# Patient Record
Sex: Female | Born: 1960 | Race: White | Hispanic: No | Smoking: Never smoker
Health system: Southern US, Community
[De-identification: ages and names within clinical notes are randomized; demographics above are authoritative.]

## PROBLEM LIST (undated history)

## (undated) DIAGNOSIS — G459 Transient cerebral ischemic attack, unspecified: Secondary | ICD-10-CM

## (undated) DIAGNOSIS — C801 Malignant (primary) neoplasm, unspecified: Secondary | ICD-10-CM

## (undated) DIAGNOSIS — I639 Cerebral infarction, unspecified: Secondary | ICD-10-CM

## (undated) DIAGNOSIS — E871 Hypo-osmolality and hyponatremia: Secondary | ICD-10-CM

## (undated) DIAGNOSIS — E119 Type 2 diabetes mellitus without complications: Secondary | ICD-10-CM

## (undated) HISTORY — PX: ABDOMINAL HYSTERECTOMY: SHX81

## (undated) HISTORY — PX: CORONARY ARTERY BYPASS GRAFT: SHX141

---

## 2003-06-21 ENCOUNTER — Ambulatory Visit (HOSPITAL_COMMUNITY): Admission: RE | Admit: 2003-06-21 | Discharge: 2003-06-21 | Payer: Self-pay | Admitting: Orthopedic Surgery

## 2003-06-21 ENCOUNTER — Ambulatory Visit (HOSPITAL_BASED_OUTPATIENT_CLINIC_OR_DEPARTMENT_OTHER): Admission: RE | Admit: 2003-06-21 | Discharge: 2003-06-21 | Payer: Self-pay | Admitting: Orthopedic Surgery

## 2005-02-23 ENCOUNTER — Encounter: Admission: RE | Admit: 2005-02-23 | Discharge: 2005-02-23 | Payer: Self-pay | Admitting: Orthopedic Surgery

## 2005-03-14 ENCOUNTER — Encounter: Admission: RE | Admit: 2005-03-14 | Discharge: 2005-03-14 | Payer: Self-pay | Admitting: Orthopedic Surgery

## 2014-12-24 ENCOUNTER — Inpatient Hospital Stay (HOSPITAL_COMMUNITY)
Admission: EM | Admit: 2014-12-24 | Discharge: 2014-12-27 | DRG: 643 | Disposition: A | Discharge status: Other Acute Inpatient Hospital | Payer: Medicare Other | Attending: Internal Medicine | Admitting: Internal Medicine

## 2014-12-24 ENCOUNTER — Emergency Department (HOSPITAL_COMMUNITY): Payer: Medicare Other

## 2014-12-24 ENCOUNTER — Encounter (HOSPITAL_COMMUNITY): Payer: Self-pay | Admitting: *Deleted

## 2014-12-24 DIAGNOSIS — E871 Hypo-osmolality and hyponatremia: Secondary | ICD-10-CM | POA: Diagnosis present

## 2014-12-24 DIAGNOSIS — C7951 Secondary malignant neoplasm of bone: Secondary | ICD-10-CM | POA: Diagnosis present

## 2014-12-24 DIAGNOSIS — E119 Type 2 diabetes mellitus without complications: Secondary | ICD-10-CM | POA: Diagnosis present

## 2014-12-24 DIAGNOSIS — C787 Secondary malignant neoplasm of liver and intrahepatic bile duct: Secondary | ICD-10-CM | POA: Diagnosis present

## 2014-12-24 DIAGNOSIS — R410 Disorientation, unspecified: Secondary | ICD-10-CM | POA: Diagnosis present

## 2014-12-24 DIAGNOSIS — E222 Syndrome of inappropriate secretion of antidiuretic hormone: Secondary | ICD-10-CM | POA: Diagnosis not present

## 2014-12-24 DIAGNOSIS — I10 Essential (primary) hypertension: Secondary | ICD-10-CM | POA: Diagnosis present

## 2014-12-24 DIAGNOSIS — Z90710 Acquired absence of both cervix and uterus: Secondary | ICD-10-CM

## 2014-12-24 DIAGNOSIS — N390 Urinary tract infection, site not specified: Secondary | ICD-10-CM | POA: Diagnosis present

## 2014-12-24 DIAGNOSIS — Z8673 Personal history of transient ischemic attack (TIA), and cerebral infarction without residual deficits: Secondary | ICD-10-CM

## 2014-12-24 DIAGNOSIS — Z951 Presence of aortocoronary bypass graft: Secondary | ICD-10-CM

## 2014-12-24 DIAGNOSIS — G9341 Metabolic encephalopathy: Secondary | ICD-10-CM | POA: Diagnosis present

## 2014-12-24 DIAGNOSIS — Z79899 Other long term (current) drug therapy: Secondary | ICD-10-CM

## 2014-12-24 DIAGNOSIS — Z91040 Latex allergy status: Secondary | ICD-10-CM

## 2014-12-24 DIAGNOSIS — G9340 Encephalopathy, unspecified: Secondary | ICD-10-CM | POA: Diagnosis present

## 2014-12-24 DIAGNOSIS — Z888 Allergy status to other drugs, medicaments and biological substances status: Secondary | ICD-10-CM

## 2014-12-24 DIAGNOSIS — C3490 Malignant neoplasm of unspecified part of unspecified bronchus or lung: Secondary | ICD-10-CM | POA: Diagnosis present

## 2014-12-24 DIAGNOSIS — Y92481 Parking lot as the place of occurrence of the external cause: Secondary | ICD-10-CM

## 2014-12-24 DIAGNOSIS — W19XXXA Unspecified fall, initial encounter: Secondary | ICD-10-CM | POA: Diagnosis present

## 2014-12-24 HISTORY — DX: Transient cerebral ischemic attack, unspecified: G45.9

## 2014-12-24 HISTORY — DX: Hypo-osmolality and hyponatremia: E87.1

## 2014-12-24 HISTORY — DX: Malignant (primary) neoplasm, unspecified: C80.1

## 2014-12-24 HISTORY — DX: Type 2 diabetes mellitus without complications: E11.9

## 2014-12-24 HISTORY — DX: Cerebral infarction, unspecified: I63.9

## 2014-12-24 LAB — CBC WITH DIFFERENTIAL/PLATELET
Basophils Absolute: 0 10*3/uL (ref 0.0–0.1)
Basophils Relative: 0 %
Eosinophils Absolute: 0.1 10*3/uL (ref 0.0–0.7)
Eosinophils Relative: 1 %
HCT: 36 % (ref 36.0–46.0)
Hemoglobin: 12.2 g/dL (ref 12.0–15.0)
Lymphocytes Relative: 16 %
Lymphs Abs: 1.4 10*3/uL (ref 0.7–4.0)
MCH: 26.6 pg (ref 26.0–34.0)
MCHC: 33.9 g/dL (ref 30.0–36.0)
MCV: 78.4 fL (ref 78.0–100.0)
Monocytes Absolute: 0.4 10*3/uL (ref 0.1–1.0)
Monocytes Relative: 5 %
Neutro Abs: 6.6 10*3/uL (ref 1.7–7.7)
Neutrophils Relative %: 78 %
Platelets: 152 10*3/uL (ref 150–400)
RBC: 4.59 MIL/uL (ref 3.87–5.11)
RDW: 13.4 % (ref 11.5–15.5)
WBC: 8.5 10*3/uL (ref 4.0–10.5)

## 2014-12-24 LAB — BASIC METABOLIC PANEL
Anion gap: 10 (ref 5–15)
BUN: 13 mg/dL (ref 6–20)
CO2: 24 mmol/L (ref 22–32)
Calcium: 9 mg/dL (ref 8.9–10.3)
Chloride: 95 mmol/L — ABNORMAL LOW (ref 101–111)
Creatinine, Ser: 0.67 mg/dL (ref 0.44–1.00)
GFR calc Af Amer: >60 mL/min (ref >60)
GFR calc non Af Amer: >60 mL/min (ref >60)
Glucose, Bld: 118 mg/dL — ABNORMAL HIGH (ref 65–99)
POTASSIUM: 4.1 mmol/L (ref 3.5–5.1)
Sodium: 129 mmol/L — ABNORMAL LOW (ref 135–145)

## 2014-12-24 LAB — URINE MICROSCOPIC-ADD ON

## 2014-12-24 LAB — URINALYSIS, ROUTINE W REFLEX MICROSCOPIC
Bilirubin Urine: NEGATIVE
Glucose, UA: NEGATIVE mg/dL
Ketones, ur: NEGATIVE mg/dL
Nitrite: NEGATIVE
PROTEIN: 30 mg/dL — AB
Specific Gravity, Urine: 1.012 (ref 1.005–1.030)
Urobilinogen, UA: 0.2 mg/dL (ref 0.0–1.0)
pH: 7 (ref 5.0–8.0)

## 2014-12-24 MED ORDER — LORAZEPAM 0.5 MG PO TABS
0.5000 mg | ORAL_TABLET | Freq: Once | ORAL | Status: AC
  Administered 2014-12-24: 0.5 mg via ORAL
  Filled 2014-12-24: qty 1

## 2014-12-24 MED ORDER — LIDOCAINE HCL (PF) 1 % IJ SOLN
2.0000 mL | Freq: Once | INTRAMUSCULAR | Status: AC
  Administered 2014-12-24: 2 mL
  Filled 2014-12-24: qty 5

## 2014-12-24 NOTE — ED Notes (Signed)
Pt with increasing confusion, trying to get out of bed, restless. NP made aware of the same. NP at bedside with assistance of Tech at this time for suturing procedure.

## 2014-12-24 NOTE — ED Provider Notes (Signed)
CSN: [id number]     Arrival date & time 12/24/14  2014 History   First MD Initiated Contact with Patient 12/24/14 2049     Chief Complaint  Patient presents with  . Fall     (Consider location/radiation/quality/duration/timing/severity/associated sxs/prior Treatment) HPI Comments: Patient arrives via EMS after [place] shopping [place]. Per history, patient lost her balance, landing primarily on left side. No reportedly loss of consciousness, but patient is confused, appears sleepy. 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  Patient is a 54 y.o. female presenting with fall. The history is provided by the patient and a caregiver. No language interpreter was used.  Fall This is a new problem. The current episode started today. The problem has been unchanged. Associated symptoms include neck pain.    Past Medical History  Diagnosis Date  . 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 Diabetes mellitus without complication   . Hyponatremia   . Stroke   . TIA (transient ischemic attack)    Past Surgical History  Procedure Laterality Date  . Coronary artery bypass graft    . Abdominal hysterectomy     No family history on file. Social History  Substance Use Topics  . Smoking status: Never Smoker   . Smokeless tobacco: None  . Alcohol Use: No   OB History    No data available     Review of Systems  Musculoskeletal: Positive for neck pain.  Skin: Positive for wound.  All other systems reviewed and are negative.     Allergies  Review of patient's allergies indicates not on file.  Home Medications   Prior to Admission medications   Not on File   BP 187/97 mmHg  Pulse 97  Temp(Src) 98 F (36.7 C) (Oral)  Resp 22  SpO2 95% Physical Exam  Constitutional: She appears well-developed and well-nourished. She appears lethargic.  HENT:  Head: Head is with laceration.    Eyes: Pupils are equal, round, and reactive  to light.  Neck: Neck supple. Spinous process tenderness present.    Cardiovascular: Normal rate and regular rhythm.   Pulmonary/Chest: Effort normal and breath sounds normal.  Abdominal: Soft. Bowel sounds are normal.  Musculoskeletal: She exhibits no edema or tenderness.  Neurological: She has normal strength. She appears lethargic.  Skin: Skin is warm and dry.  Psychiatric: Her speech is slurred.  Nursing note and vitals reviewed.   ED Course  Procedures (including critical care time) Labs Review Labs Reviewed  BASIC METABOLIC PANEL - Abnormal; Notable for the following:    Sodium 129 (*)    Chloride 95 (*)    Glucose, Bld 118 (*)    All other components within normal limits  URINALYSIS, ROUTINE W REFLEX MICROSCOPIC (NOT AT [facility]) - Abnormal; Notable for the following:    APPearance CLOUDY (*)    Hgb urine dipstick TRACE (*)    Protein, ur 30 (*)    Leukocytes, UA MODERATE (*)    All other components within normal limits  URINE MICROSCOPIC-ADD ON - Abnormal; Notable for the following:    Squamous Epithelial / LPF FEW (*)    Bacteria, UA FEW (*)    All other components within normal limits  CBC WITH DIFFERENTIAL/PLATELET    Imaging Review Dg Chest 2 View  12/24/2014   CLINICAL DATA:  [place] [address] in a parking lot today after losing her balance. Small cell lung cancer with metastases  to the liver and bone.  EXAM: CHEST  2 VIEW  COMPARISON:  None.  FINDINGS: Irregular left hilar and perihilar mass measuring 7.9 cm in maximum diameter. Mildly enlarged cardiac silhouette. Post CABG changes. Right jugular port catheter tip in the superior vena cava. Mildly prominent interstitial markings. Right shoulder fixation anchors. Mild thoracic and upper lumbar spine degenerative changes.  IMPRESSION: 1. 7.9 cm left hilar and perihilar irregular mass, compatible with the history of lung cancer. 2. Mild cardiomegaly. 3. Mild chronic interstitial lung disease.   Electronically Signed   By: Claudie Revering  M.D.   On: 12/24/2014 23:56   Ct Head Wo Contrast  12/24/2014   CLINICAL DATA:  Patient status post fall. Altered mental status. Cervical spine pain. No reported loss of consciousness. History of metastatic lung cancer.  EXAM: CT HEAD WITHOUT CONTRAST  CT CERVICAL SPINE WITHOUT CONTRAST  TECHNIQUE: Multidetector CT imaging of the head and cervical spine was performed following the standard protocol without intravenous contrast. Multiplanar CT image reconstructions of the cervical spine were also generated.  COMPARISON:  Cervical spine MRI 02/23/2005.  FINDINGS: CT HEAD FINDINGS  Extensive periventricular and subcortical white matter hypodensity compatible with chronic small vessel ischemic changes. Ventricles and sulci are prominent compatible with atrophy. No evidence for acute cortically based infarct, intracranial hemorrhage, mass lesion or mass-effect. Fluid and mucosal thickening within the left greater than right maxillary sinuses. Mastoid air cells are well aerated. Calvarium is intact. Soft tissue swelling overlying left frontal calvarium. No evidence for radiopaque foreign body.  CT CERVICAL SPINE FINDINGS  Straightening of the normal cervical lordosis. No evidence for acute cervical spine fracture. Interval development of patchy sclerotic foci visualized throughout cervical spine with the largest within the C7 and T1 vertebral bodies. Degenerative disc disease most pronounced C5-6. Craniocervical junction is intact. Multiple low-attenuation nodules within the thyroid measuring up to. Focal consolidative opacity is demonstrated within the right lung apex. Patchy ground-glass opacity left upper lobe.  IMPRESSION: No acute intracranial process.  No acute cervical spine fracture.  Multiple sclerotic foci demonstrated throughout the visualized cervical spine compatible with osseous metastasis from known history of lung cancer.  Chronic small vessel ischemic changes.  Apical consolidation which is nonspecific  and may be secondary to post treatment changes, an infectious or inflammatory process or metastatic disease. We the  These results were called by telephone at the time of interpretation on 12/24/2014 at 9:49 pm to Dr. Etta Quill , who verbally acknowledged these results.   Electronically Signed   By: Lovey Newcomer M.D.   On: 12/24/2014 21:59   Ct Cervical Spine Wo Contrast  12/24/2014   CLINICAL DATA:  Patient status post fall. Altered mental status. Cervical spine pain. No reported loss of consciousness. History of metastatic lung cancer.  EXAM: CT HEAD WITHOUT CONTRAST  CT CERVICAL SPINE WITHOUT CONTRAST  TECHNIQUE: Multidetector CT imaging of the head and cervical spine was performed following the standard protocol without intravenous contrast. Multiplanar CT image reconstructions of the cervical spine were also generated.  COMPARISON:  Cervical spine MRI 02/23/2005.  FINDINGS: CT HEAD FINDINGS  Extensive periventricular and subcortical white matter hypodensity compatible with chronic small vessel ischemic changes. Ventricles and sulci are prominent compatible with atrophy. No evidence for acute cortically based infarct, intracranial hemorrhage, mass lesion or mass-effect. Fluid and mucosal thickening within the left greater than right maxillary sinuses. Mastoid air cells are well aerated. Calvarium is intact. Soft tissue swelling overlying left frontal calvarium. No evidence for  radiopaque foreign body.  CT CERVICAL SPINE FINDINGS  Straightening of the normal cervical lordosis. No evidence for acute cervical spine fracture. Interval development of patchy sclerotic foci visualized throughout cervical spine with the largest within the C7 and T1 vertebral bodies. Degenerative disc disease most pronounced C5-6. Craniocervical junction is intact. Multiple low-attenuation nodules within the thyroid measuring up to. Focal consolidative opacity is demonstrated within the right lung apex. Patchy ground-glass opacity  left upper lobe.  IMPRESSION: No acute intracranial process.  No acute cervical spine fracture.  Multiple sclerotic foci demonstrated throughout the visualized cervical spine compatible with osseous metastasis from known history of lung cancer.  Chronic small vessel ischemic changes.  Apical consolidation which is nonspecific and may be secondary to post treatment changes, an infectious or inflammatory process or metastatic disease. We the  These results were called by telephone at the time of interpretation on 12/24/2014 at 9:49 pm to Dr. Etta Quill , who verbally acknowledged these results.   Electronically Signed   By: Lovey Newcomer M.D.   On: 12/24/2014 21:59   I have personally reviewed and evaluated these images and lab results as part of my medical decision-making.   EKG Interpretation None     Patient discussed with and seen by Dr. Venora Maples.  Patient with altered mental status and delirium. Chest xray with hilar mass secondary to known cancer but no indication of infection. No cervical fracture, but new sclerotic foci noted--family states patient had a "clean" Ct scan about 8 weeks ago. No infarct, lesion, or hemorrhage on head CT. Urine with moderate leukocytes and 11-20 WBC. Will start rocephin. Will request admit to medicine.. MDM   Final diagnoses:  None    Delirium. Concussion verses infection.     Etta Quill, NP 12/25/14 [place], MD 12/26/14 [phone]

## 2014-12-24 NOTE — ED Notes (Signed)
Bed: [facility] Expected date:  Expected time:  Means of arrival:  Comments: EMS fall

## 2014-12-24 NOTE — ED Notes (Signed)
Patient transported to CT 

## 2014-12-24 NOTE — ED Notes (Signed)
Pt arrives via ems, fully immobilized. The pt was in a parking lot, lost her balance and fell. She has multiple facial abrasion and and lac to the left temporal area. Denies LOC. Pt has confusion, per the daughter the pt has hx of the same. VSS en route.

## 2014-12-25 DIAGNOSIS — G9340 Encephalopathy, unspecified: Secondary | ICD-10-CM | POA: Diagnosis not present

## 2014-12-25 DIAGNOSIS — Z91040 Latex allergy status: Secondary | ICD-10-CM | POA: Diagnosis not present

## 2014-12-25 DIAGNOSIS — C3490 Malignant neoplasm of unspecified part of unspecified bronchus or lung: Secondary | ICD-10-CM | POA: Diagnosis present

## 2014-12-25 DIAGNOSIS — N390 Urinary tract infection, site not specified: Secondary | ICD-10-CM | POA: Diagnosis present

## 2014-12-25 DIAGNOSIS — G9341 Metabolic encephalopathy: Secondary | ICD-10-CM | POA: Diagnosis not present

## 2014-12-25 DIAGNOSIS — R410 Disorientation, unspecified: Secondary | ICD-10-CM | POA: Diagnosis present

## 2014-12-25 DIAGNOSIS — C7951 Secondary malignant neoplasm of bone: Secondary | ICD-10-CM | POA: Diagnosis not present

## 2014-12-25 DIAGNOSIS — W19XXXA Unspecified fall, initial encounter: Secondary | ICD-10-CM | POA: Diagnosis not present

## 2014-12-25 DIAGNOSIS — E871 Hypo-osmolality and hyponatremia: Secondary | ICD-10-CM | POA: Diagnosis present

## 2014-12-25 DIAGNOSIS — C787 Secondary malignant neoplasm of liver and intrahepatic bile duct: Secondary | ICD-10-CM | POA: Diagnosis not present

## 2014-12-25 DIAGNOSIS — Z888 Allergy status to other drugs, medicaments and biological substances status: Secondary | ICD-10-CM | POA: Diagnosis not present

## 2014-12-25 DIAGNOSIS — Z951 Presence of aortocoronary bypass graft: Secondary | ICD-10-CM | POA: Diagnosis not present

## 2014-12-25 DIAGNOSIS — I10 Essential (primary) hypertension: Secondary | ICD-10-CM | POA: Diagnosis not present

## 2014-12-25 DIAGNOSIS — Z8673 Personal history of transient ischemic attack (TIA), and cerebral infarction without residual deficits: Secondary | ICD-10-CM | POA: Diagnosis not present

## 2014-12-25 DIAGNOSIS — Z90710 Acquired absence of both cervix and uterus: Secondary | ICD-10-CM | POA: Diagnosis not present

## 2014-12-25 DIAGNOSIS — E119 Type 2 diabetes mellitus without complications: Secondary | ICD-10-CM | POA: Diagnosis not present

## 2014-12-25 DIAGNOSIS — E222 Syndrome of inappropriate secretion of antidiuretic hormone: Secondary | ICD-10-CM | POA: Diagnosis not present

## 2014-12-25 DIAGNOSIS — Z79899 Other long term (current) drug therapy: Secondary | ICD-10-CM | POA: Diagnosis not present

## 2014-12-25 DIAGNOSIS — Y92481 Parking lot as the place of occurrence of the external cause: Secondary | ICD-10-CM | POA: Diagnosis not present

## 2014-12-25 LAB — BASIC METABOLIC PANEL
ANION GAP: 9 (ref 5–15)
BUN: 12 mg/dL (ref 6–20)
CALCIUM: 8.7 mg/dL — AB (ref 8.9–10.3)
CO2: 26 mmol/L (ref 22–32)
Chloride: 90 mmol/L — ABNORMAL LOW (ref 101–111)
Creatinine, Ser: 0.62 mg/dL (ref 0.44–1.00)
Glucose, Bld: 158 mg/dL — ABNORMAL HIGH (ref 65–99)
POTASSIUM: 4.3 mmol/L (ref 3.5–5.1)
Sodium: 125 mmol/L — ABNORMAL LOW (ref 135–145)

## 2014-12-25 MED ORDER — OXYMORPHONE HCL ER 20 MG PO TB12
40.0000 mg | ORAL_TABLET | Freq: Two times a day (BID) | ORAL | Status: DC
  Administered 2014-12-25 – 2014-12-26 (×4): 40 mg via ORAL
  Filled 2014-12-25 (×4): qty 2

## 2014-12-25 MED ORDER — DEXTROSE 5 % IV SOLN
1.0000 g | Freq: Once | INTRAVENOUS | Status: DC
  Filled 2014-12-25: qty 10

## 2014-12-25 MED ORDER — PROCHLORPERAZINE MALEATE 10 MG PO TABS
10.0000 mg | ORAL_TABLET | Freq: Four times a day (QID) | ORAL | Status: DC | PRN
  Administered 2014-12-26: 10 mg via ORAL
  Filled 2014-12-25 (×2): qty 1

## 2014-12-25 MED ORDER — OXYMORPHONE HCL ER 20 MG PO TB12: 40.0000 mg | ORAL_TABLET | Freq: Two times a day (BID) | ORAL | Status: DC

## 2014-12-25 MED ORDER — OXYMORPHONE HCL ER 40 MG PO TB12: 40.0000 mg | ORAL_TABLET | Freq: Two times a day (BID) | ORAL | Status: DC

## 2014-12-25 MED ORDER — SODIUM CHLORIDE 1 G PO TABS
2.0000 g | ORAL_TABLET | Freq: Four times a day (QID) | ORAL | Status: DC
  Administered 2014-12-25 – 2014-12-26 (×6): 2 g via ORAL
  Filled 2014-12-25 (×11): qty 2

## 2014-12-25 MED ORDER — LORAZEPAM 0.5 MG PO TABS
0.5000 mg | ORAL_TABLET | Freq: Four times a day (QID) | ORAL | Status: DC | PRN
  Filled 2014-12-25: qty 1

## 2014-12-25 MED ORDER — OXYCODONE HCL 5 MG PO TABS
10.0000 mg | ORAL_TABLET | ORAL | Status: DC | PRN
  Administered 2014-12-25: 10 mg via ORAL
  Filled 2014-12-25 (×2): qty 2

## 2014-12-25 MED ORDER — HEPARIN SODIUM (PORCINE) 5000 UNIT/ML IJ SOLN
5000.0000 [IU] | Freq: Three times a day (TID) | INTRAMUSCULAR | Status: DC
  Administered 2014-12-25 – 2014-12-26 (×5): 5000 [IU] via SUBCUTANEOUS
  Filled 2014-12-25 (×10): qty 1

## 2014-12-25 MED ORDER — DEXTROSE 5 % IV SOLN
1.0000 g | INTRAVENOUS | Status: DC
  Administered 2014-12-25 – 2014-12-26 (×2): 1 g via INTRAVENOUS
  Filled 2014-12-25 (×3): qty 10

## 2014-12-25 MED ORDER — HYDRALAZINE HCL 20 MG/ML IJ SOLN: 10.0000 mg | INTRAMUSCULAR | Status: DC | PRN

## 2014-12-25 NOTE — H&P (Addendum)
[facility] History and Physical  Grace Parks CMK:[id number] DOB: [birth year] DOA: 12/24/2014  Referring physician: EDP PCP: No primary care provider on file.   Chief Complaint: Fall   HPI: Grace Parks is a 54 y.o. female who presents to the ED after [place].  Hit left forehead in fall.  Patient is confused and sleepy.  H/o SCLC currently on immunotherapy.  Prior to fall today had normal mentation.  Review of Systems: Systems reviewed.  As above, otherwise negative  Past Medical History  Diagnosis Date  . 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 Diabetes mellitus without complication   . Hyponatremia   . Stroke   . TIA (transient ischemic attack)    Past Surgical History  Procedure Laterality Date  . Coronary artery bypass graft    . Abdominal hysterectomy     Social History:  reports that she has never smoked. She does not have any smokeless tobacco history on file. She reports that she does not drink alcohol. Her drug history is not on file.  Allergies  Allergen Reactions  . Flagyl [Metronidazole] Other (See Comments)    Reaction: unknown  . Latex Other (See Comments)    Reaction: unknown   . Other Other (See Comments)    Pt's daughter reports that the pt is allergic to numerous antibiotics but she is not sure about which specific ones.   . Albuterol Palpitations    No family history on file.   Prior to Admission medications   Medication Sig Start Date End Date Taking? Authorizing Provider  diphenhydrAMINE (SOMINEX) 25 MG tablet Take 25 mg by mouth daily as needed for allergies or sleep.   Yes Historical Provider, MD  LORazepam (ATIVAN) 0.5 MG tablet Take 0.5 mg by mouth every 6 (six) hours as needed for anxiety.   Yes Historical Provider, MD  Magnesium Cl-Calcium Carbonate (SLOW-MAG PO) Take 1 tablet by mouth daily. 238 mg Calcium  143 mg magnesium  416 mg Chloride   Yes Historical Provider, MD   nivolumab 3 mg/kg in sodium chloride 0.9 % 100 mL Inject 3 mg/kg into the vein every 14 (fourteen) days. Opdivo   Yes Historical Provider, MD  oxyCODONE (OXY IR/ROXICODONE) 5 MG immediate release tablet Take 10 mg by mouth every 4 (four) hours as needed for moderate pain or severe pain.   Yes Historical Provider, MD  oxymorphone (OPANA ER) 40 MG 12 hr tablet Take 40 mg by mouth every 12 (twelve) hours.   Yes Historical Provider, MD  prochlorperazine (COMPAZINE) 10 MG tablet Take 10 mg by mouth every 6 (six) hours as needed for nausea or vomiting.   Yes Historical Provider, MD  sodium chloride 1 G tablet Take 2 g by mouth 4 (four) times daily.   Yes Historical Provider, MD  zolpidem (AMBIEN CR) 12.5 MG CR tablet Take 6.25 mg by mouth at bedtime as needed for sleep.   Yes Historical Provider, MD   Physical Exam: Filed Vitals:   12/25/14 0042  BP: 165/86  Pulse: 91  Temp:   Resp: 18    BP 165/86 mmHg  Pulse 91  Temp(Src) 98 F (36.7 C) (Oral)  Resp 18  SpO2 93%  General Appearance:    Alert, oriented, no distress, appears stated age  Head:    Normocephalic, atraumatic  Eyes:    PERRL, EOMI, sclera non-icteric        Nose:  Nares without drainage or epistaxis. Mucosa, turbinates normal  Throat:   Moist mucous membranes. Oropharynx without erythema or exudate.  Neck:   Supple. No carotid bruits.  No thyromegaly.  No lymphadenopathy.   Back:     No CVA tenderness, no spinal tenderness  Lungs:     Clear to auscultation bilaterally, without wheezes, rhonchi or rales  Chest wall:    No tenderness to palpitation  Heart:    Regular rate and rhythm without murmurs, gallops, rubs  Abdomen:     Soft, non-tender, nondistended, normal bowel sounds, no organomegaly  Genitalia:    deferred  Rectal:    deferred  Extremities:   No clubbing, cyanosis or edema.  Pulses:   2+ and symmetric all extremities  Skin:   Skin color, texture, turgor normal, no rashes or lesions  Lymph nodes:   Cervical,  supraclavicular, and axillary nodes normal  Neurologic:   CNII-XII intact. Normal strength, sensation and reflexes      throughout    Labs on Admission:  Basic Metabolic Panel:  Recent Labs Lab 12/24/14 2114  NA 129*  K 4.1  CL 95*  CO2 24  GLUCOSE 118*  BUN 13  CREATININE 0.67  CALCIUM 9.0   Liver Function Tests: No results for input(s): AST, ALT, ALKPHOS, BILITOT, PROT, ALBUMIN in the last 168 hours. No results for input(s): LIPASE, AMYLASE in the last 168 hours. No results for input(s): AMMONIA in the last 168 hours. CBC:  Recent Labs Lab 12/24/14 2114  WBC 8.5  NEUTROABS 6.6  HGB 12.2  HCT 36.0  MCV 78.4  PLT 152   Cardiac Enzymes: No results for input(s): CKTOTAL, CKMB, CKMBINDEX, TROPONINI in the last 168 hours.  BNP (last 3 results) No results for input(s): PROBNP in the last 8760 hours. CBG: No results for input(s): GLUCAP in the last 168 hours.  Radiological Exams on Admission: Dg Chest 2 View  12/24/2014   CLINICAL DATA:  [place] [address] in a parking lot today after losing her balance. Small cell lung cancer with metastases to the liver and bone.  EXAM: CHEST  2 VIEW  COMPARISON:  None.  FINDINGS: Irregular left hilar and perihilar mass measuring 7.9 cm in maximum diameter. Mildly enlarged cardiac silhouette. Post CABG changes. Right jugular port catheter tip in the superior vena cava. Mildly prominent interstitial markings. Right shoulder fixation anchors. Mild thoracic and upper lumbar spine degenerative changes.  IMPRESSION: 1. 7.9 cm left hilar and perihilar irregular mass, compatible with the history of lung cancer. 2. Mild cardiomegaly. 3. Mild chronic interstitial lung disease.   Electronically Signed   By: Claudie Revering M.D.   On: 12/24/2014 23:56   Ct Head Wo Contrast  12/24/2014   CLINICAL DATA:  Patient status post fall. Altered mental status. Cervical spine pain. No reported loss of consciousness. History of metastatic lung cancer.  EXAM: CT HEAD WITHOUT  CONTRAST  CT CERVICAL SPINE WITHOUT CONTRAST  TECHNIQUE: Multidetector CT imaging of the head and cervical spine was performed following the standard protocol without intravenous contrast. Multiplanar CT image reconstructions of the cervical spine were also generated.  COMPARISON:  Cervical spine MRI 02/23/2005.  FINDINGS: CT HEAD FINDINGS  Extensive periventricular and subcortical white matter hypodensity compatible with chronic small vessel ischemic changes. Ventricles and sulci are prominent compatible with atrophy. No evidence for acute cortically based infarct, intracranial hemorrhage, mass lesion or mass-effect. Fluid and mucosal thickening within the left greater than right maxillary sinuses. Mastoid air cells are well aerated.  Calvarium is intact. Soft tissue swelling overlying left frontal calvarium. No evidence for radiopaque foreign body.  CT CERVICAL SPINE FINDINGS  Straightening of the normal cervical lordosis. No evidence for acute cervical spine fracture. Interval development of patchy sclerotic foci visualized throughout cervical spine with the largest within the C7 and T1 vertebral bodies. Degenerative disc disease most pronounced C5-6. Craniocervical junction is intact. Multiple low-attenuation nodules within the thyroid measuring up to. Focal consolidative opacity is demonstrated within the right lung apex. Patchy ground-glass opacity left upper lobe.  IMPRESSION: No acute intracranial process.  No acute cervical spine fracture.  Multiple sclerotic foci demonstrated throughout the visualized cervical spine compatible with osseous metastasis from known history of lung cancer.  Chronic small vessel ischemic changes.  Apical consolidation which is nonspecific and may be secondary to post treatment changes, an infectious or inflammatory process or metastatic disease. We the  These results were called by telephone at the time of interpretation on 12/24/2014 at 9:49 pm to Dr. Etta Quill , who verbally  acknowledged these results.   Electronically Signed   By: Lovey Newcomer M.D.   On: 12/24/2014 21:59   Ct Cervical Spine Wo Contrast  12/24/2014   CLINICAL DATA:  Patient status post fall. Altered mental status. Cervical spine pain. No reported loss of consciousness. History of metastatic lung cancer.  EXAM: CT HEAD WITHOUT CONTRAST  CT CERVICAL SPINE WITHOUT CONTRAST  TECHNIQUE: Multidetector CT imaging of the head and cervical spine was performed following the standard protocol without intravenous contrast. Multiplanar CT image reconstructions of the cervical spine were also generated.  COMPARISON:  Cervical spine MRI 02/23/2005.  FINDINGS: CT HEAD FINDINGS  Extensive periventricular and subcortical white matter hypodensity compatible with chronic small vessel ischemic changes. Ventricles and sulci are prominent compatible with atrophy. No evidence for acute cortically based infarct, intracranial hemorrhage, mass lesion or mass-effect. Fluid and mucosal thickening within the left greater than right maxillary sinuses. Mastoid air cells are well aerated. Calvarium is intact. Soft tissue swelling overlying left frontal calvarium. No evidence for radiopaque foreign body.  CT CERVICAL SPINE FINDINGS  Straightening of the normal cervical lordosis. No evidence for acute cervical spine fracture. Interval development of patchy sclerotic foci visualized throughout cervical spine with the largest within the C7 and T1 vertebral bodies. Degenerative disc disease most pronounced C5-6. Craniocervical junction is intact. Multiple low-attenuation nodules within the thyroid measuring up to. Focal consolidative opacity is demonstrated within the right lung apex. Patchy ground-glass opacity left upper lobe.  IMPRESSION: No acute intracranial process.  No acute cervical spine fracture.  Multiple sclerotic foci demonstrated throughout the visualized cervical spine compatible with osseous metastasis from known history of lung cancer.   Chronic small vessel ischemic changes.  Apical consolidation which is nonspecific and may be secondary to post treatment changes, an infectious or inflammatory process or metastatic disease. We the  These results were called by telephone at the time of interpretation on 12/24/2014 at 9:49 pm to Dr. Etta Quill , who verbally acknowledged these results.   Electronically Signed   By: Lovey Newcomer M.D.   On: 12/24/2014 21:59    EKG: Independently reviewed.  Assessment/Plan Active Problems:   UTI (lower urinary tract infection)   Delirium   SIADH (syndrome of inappropriate ADH production)   Acute delirium   1. Delirium - due to concussion vs UTI 1. CT head negative for bleed or other major intracranial injury 2. UTI - 1. Treating with rocephin 2. Cultures pending 3. SIADH -  mild hyponatremia with sodium of 129 1. Continue salt tabs 2. Fluid restrict diet to 1.5L / day    Code Status: Full Code  Family Communication: Daughter at bedside Disposition Plan: Admit to obs   Time spent: 70 min  GARDNER, JARED M. [facility] Pager [phone]  If 7AM-7PM, please contact the day team taking care of the patient [email or web address] Password [email or web address] 12/25/2014, 1:10 AM

## 2014-12-25 NOTE — ED Notes (Signed)
Spoke with dr. Alcario Drought about the pt and allergy list, per him, hold rocephin and attempt for pharmacy tech to call [facility] to get pt med list. Updated pt nurse, VERA on the floor about the allergy status at this time.

## 2014-12-25 NOTE — Progress Notes (Signed)
Pt admitted after midnight. She presented status post fall. CT head with no acute findings. Found to have UTI, treated with rocephin  Will obtain PT eval  Leisa Lenz TRH [phone]

## 2014-12-25 NOTE — ED Notes (Addendum)
In to give pt meds, verified allergies with daughter, daughter unsure of the pt allergy. Called pharmacy tech who will call [facility] to get accurate medication allergy list for the pt. Held rocephin at this point because pt daughter reports pt is allergic to several abx and unsure of names. Receiving rn on the floor made aware of the same.  [id number] addendum. Notified dr. Alcario Drought at this time pt daughter unsure of allergy list, hold rocephin, requested pharmacy tech to call [facility] to update allergy list. Updated vera on the floor at this time.

## 2014-12-25 NOTE — ED Notes (Signed)
Admitting md at bedside

## 2014-12-26 DIAGNOSIS — C3490 Malignant neoplasm of unspecified part of unspecified bronchus or lung: Secondary | ICD-10-CM

## 2014-12-26 DIAGNOSIS — E871 Hypo-osmolality and hyponatremia: Secondary | ICD-10-CM

## 2014-12-26 DIAGNOSIS — R410 Disorientation, unspecified: Secondary | ICD-10-CM

## 2014-12-26 DIAGNOSIS — E222 Syndrome of inappropriate secretion of antidiuretic hormone: Principal | ICD-10-CM

## 2014-12-26 DIAGNOSIS — G9340 Encephalopathy, unspecified: Secondary | ICD-10-CM

## 2014-12-26 DIAGNOSIS — N390 Urinary tract infection, site not specified: Secondary | ICD-10-CM

## 2014-12-26 LAB — BASIC METABOLIC PANEL
Anion gap: 9 (ref 5–15)
BUN: 19 mg/dL (ref 6–20)
CHLORIDE: 93 mmol/L — AB (ref 101–111)
CO2: 22 mmol/L (ref 22–32)
CREATININE: 0.58 mg/dL (ref 0.44–1.00)
Calcium: 8.5 mg/dL — ABNORMAL LOW (ref 8.9–10.3)
GFR calc Af Amer: >60 mL/min (ref >60)
GFR calc non Af Amer: >60 mL/min (ref >60)
GLUCOSE: 111 mg/dL — AB (ref 65–99)
POTASSIUM: 3.7 mmol/L (ref 3.5–5.1)
Sodium: 124 mmol/L — ABNORMAL LOW (ref 135–145)

## 2014-12-26 MED ORDER — DEXTROSE 5 % IV SOLN
INTRAVENOUS | Status: DC
Start: 2014-12-26 — End: 2014-12-27
  Administered 2014-12-26: 15:00:00 via INTRAVENOUS

## 2014-12-26 MED ORDER — HYDRALAZINE HCL 20 MG/ML IJ SOLN: 10.0000 mg | Freq: Four times a day (QID) | INTRAMUSCULAR | Status: DC | PRN

## 2014-12-26 MED ORDER — DEXTROSE 5 % IV SOLN: 1.0000 g | INTRAVENOUS | Status: DC

## 2014-12-26 MED ORDER — SODIUM CHLORIDE 1 G PO TABS
2.0000 g | ORAL_TABLET | Freq: Four times a day (QID) | ORAL | Status: DC
  Administered 2014-12-26 (×2): 2 g via ORAL
  Filled 2014-12-26 (×7): qty 2

## 2014-12-26 NOTE — Evaluation (Signed)
Physical Therapy Evaluation Patient Details Name: Grace Parks MRN: [id number] DOB: Sep 23, 1960 Today's Date: 12/26/2014   History of Present Illness  Grace Parks is a 54 y.o. female who presents to the ED after falling in a parking lot shopping center. Hit left forehead in fall.CT, xrays negative for fx  H/o SCLC  wtih multiple boney mets,currently on immunotherapy.   Clinical Impression  Pt admitted with above diagnosis. Pt currently with functional limitations due to the deficits listed below (see PT Problem List).  Pt will benefit from skilled PT to increase their independence and safety with mobility to allow discharge to the venue listed below.   Pt dtr present and reports that pt lives with her grandparents and they are unable to assist much physically; pt is pleasant and cooperative but limited by pain this date; she is not confused but does present with delayed processing and is not at her baseline mentation  per dtr;      Follow Up Recommendations Home health PT;SNF (depending on progress)    Equipment Recommendations  Rolling walker with 5" wheels    Recommendations for Other Services       Precautions / Restrictions Precautions Precautions: Fall Restrictions Weight Bearing Restrictions: No      Mobility  Bed Mobility Overal bed mobility: Needs Assistance Bed Mobility: Supine to Sit;Sit to Supine     Supine to sit: Min assist Sit to supine: Mod assist   General bed mobility comments: incr time, cues for technique, assist to bring trunk to upright and assist with LEs onto bed adn trunk positioning  Transfers Overall transfer level: Needs assistance Equipment used: Rolling walker (2 wheeled) Transfers: Sit to/from Stand Sit to Stand: Min assist;+2 safety/equipment         General transfer comment: assist to rise, stabilize; pt did not want to attempt bed to chair d/t pain  Ambulation/Gait                Stairs             Wheelchair Mobility    Modified Rankin (Stroke Patients Only)       Balance Overall balance assessment: Needs assistance Sitting-balance support: No upper extremity supported;Single extremity supported;Feet supported Sitting balance-Leahy Scale: Fair     Standing balance support: Bilateral upper extremity supported Standing balance-Leahy Scale: Poor                 High Level Balance Comments: standing tolerance limited by pain/fatigue             Pertinent Vitals/Pain Pain Assessment: Faces Faces Pain Scale: Hurts even more Pain Descriptors / Indicators: Shooting;Grimacing;Guarding Pain Intervention(s): Limited activity within patient's tolerance;Monitored during session;Repositioned    Home Living Family/patient expects to be discharged to:: Private residence Living Arrangements: Parent Available Help at Discharge: Family Type of Home: House Home Access: Level entry     Home Layout: One level Home Equipment: Cane - single point      Prior Function Level of Independence: Independent               Hand Dominance        Extremity/Trunk Assessment   Upper Extremity Assessment: Generalized weakness           Lower Extremity Assessment: Generalized weakness;RLE deficits/detail;LLE deficits/detail   LLE Deficits / Details: LLE weaker than right, grossly 3/5; AAROM WFL, but painful     Communication   Communication: No difficulties  Cognition Arousal/Alertness: Awake/alert Behavior During Therapy: WFL for  tasks assessed/performed Overall Cognitive Status: Impaired/Different from baseline Area of Impairment: Following commands;Problem solving       Following Commands: Follows one step commands with increased time     Problem Solving: Difficulty sequencing;Requires verbal cues;Requires tactile cues;Slow processing General Comments: requires step by step cues for functional tasks    General Comments      Exercises         Assessment/Plan    PT Assessment Patient needs continued PT services  PT Diagnosis Difficulty walking   PT Problem List Decreased strength;Decreased activity tolerance;Decreased mobility;Decreased balance;Decreased safety awareness  PT Treatment Interventions DME instruction;Gait training;Functional mobility training;Therapeutic activities;Patient/family education;Therapeutic exercise   PT Goals (Current goals can be found in the Care Plan section) Acute Rehab PT Goals Patient Stated Goal: to transfer to [facility] PT Goal Formulation: With patient Time For Goal Achievement: 01/07/15 Potential to Achieve Goals: Good    Frequency Min 3X/week   Barriers to discharge        Co-evaluation               End of Session Equipment Utilized During Treatment: Gait belt Activity Tolerance: Patient limited by pain;Patient limited by fatigue Patient left: in bed;with call bell/phone within reach;with nursing/sitter in room;with family/visitor present Nurse Communication: Mobility status         Time: 1440-1505 PT Time Calculation (min) (ACUTE ONLY): 25 min   Charges:   PT Evaluation $Initial PT Evaluation Tier I: 1 Procedure PT Treatments $Therapeutic Activity: 8-22 mins   PT G Codes:        Eunie Lawn 12-30-2014, 3:53 PM

## 2014-12-26 NOTE — Discharge Summary (Addendum)
Physician Discharge Summary  Grace Parks IRW:[id number] DOB: 1960/09/18 DOA: 12/24/2014  PCP: No primary care provider on file. - pt follows in [facility] hospital for oncology related issues   Admit date: 12/24/2014 Discharge date: 12/26/2014  Recommendations for Outpatient Follow-up:  Discharge prepared for transfer to [facility] Patient treated empirically with Rocephin for UTI. Urine culture pending 9/25.  Discharge Diagnoses:  Principal Problem:   Fall Active Problems:   Acute encephalopathy   UTI (lower urinary tract infection)   SIADH (syndrome of inappropriate ADH production)   Hyponatremia   Essential hypertension   SCLC (small cell lung carcinoma)   Acute delirium    Discharge Condition: stable   Diet recommendation: as tolerated   History of present illness:  54 year old female with past medical history of SCLC on immunotherapy (follows at [facility] for all oncology related issues). She presented to [facility] ED status post fall outside while shopping. She apparently fell forward however patient is not able to provide details of present illness due to altered mental status.   She was hemodynamically stable on admission. Her CT of head and cervical spine showed no acute intracranial findings. CXR showed 7.9 cm left hilar and perihilar irregular mass compatible with history of lung cancer. Blood work was notable for sodium of 129 otherwise unremarkable. She was found to have UTI on admission and started on empiric rocephin.    Hospital Course:   Principal Problem: Fall  - Likely due to acute encephalopathy, weakness - No acute intercranial findings on CT head and cervical spine - PT evaluation pending as of today. It will likely be done in [facility]   Active Problems: Acute encephalopathy / Acute delirium - Acute encephalopathy likely from hyponatremia from SIADH secondary to lung malignancy  - CT head showed no acute intracranial findings - If she does  not go to [facility] we will obtain MRI brain to rule out potential brain metastases   Hyponatremia / SIADH - No previous labs for comparison however patient is still taking sodium tablets 4 times daily at home so her hyponatremia is most likely chronic related to SIADH from history of lung cancer. - Fluids changed from normal saline to D5 water - Her sodium has slowly trended down from 129 down to 124. Her mental status was altered so unable to take by mouth meds on admission.  - Resume sodium tablets per home regimen.  UTI (lower urinary tract infection) - Found to have moderate leukocytes and few bacteremia on UA on admission - Urine culture is pending as of 12/26/14 - She is on empiric rocephin which can be continued in [place] if she goes there    [place] (small cell lung carcinoma) - On immunotherapy with Nivolumab - All oncology issues managed in [facility] hospital    Signed:  Leisa Lenz, MD  [facility] 12/26/2014, 1:58 PM  Pager #: [phone]  Time spent in minutes: more than 30 minutes    Discharge Exam: Filed Vitals:   12/26/14 1321  BP: 124/68  Pulse: 75  Temp: 98.5 F (36.9 C)  Resp: 16   Filed Vitals:   12/25/14 1420 12/25/14 2151 12/26/14 0602 12/26/14 1321  BP: 150/89 152/81 129/78 124/68  Pulse: 78 84 74 75  Temp: 97.5 F (36.4 C) 98.2 F (36.8 C) 97.9 F (36.6 C) 98.5 F (36.9 C)  TempSrc: Oral Oral Oral Oral  Resp: '18 18 16 16  '$ SpO2: 97% 98% 97% 98%    General: Pt is confused but  not in acute distress Cardiovascular: Regular rate and rhythm, S1/S2 + Respiratory: Clear to auscultation bilaterally, no wheezing, no crackles, no rhonchi Abdominal: Soft, non tender, non distended, bowel sounds +, no guarding Extremities: no edema, no cyanosis, pulses palpable bilaterally DP and PT Neuro: Grossly nonfocal  Discharge Instructions  Discharge Instructions    Call MD for:  difficulty breathing, headache or visual disturbances    Complete by:   As directed      Call MD for:  persistant nausea and vomiting    Complete by:  As directed      Call MD for:  severe uncontrolled pain    Complete by:  As directed      Diet - low sodium heart healthy    Complete by:  As directed      Discharge instructions    Complete by:  As directed   Discharge prepared for transfer to [facility] Patient treated empirically with Rocephin for UTI. Urine culture pending 9/25.     Increase activity slowly    Complete by:  As directed             Medication List    TAKE these medications        cefTRIAXone 1 g in dextrose 5 % 50 mL  Inject 1 g into the vein daily.     diphenhydrAMINE 25 MG tablet  Commonly known as:  SOMINEX  Take 25 mg by mouth daily as needed for allergies or sleep.     LORazepam 0.5 MG tablet  Commonly known as:  ATIVAN  Take 0.5 mg by mouth every 6 (six) hours as needed for anxiety.     nivolumab 3 mg/kg in sodium chloride 0.9 % 100 mL  Inject 3 mg/kg into the vein every 14 (fourteen) days. Opdivo     oxyCODONE 5 MG immediate release tablet  Commonly known as:  Oxy IR/ROXICODONE  Take 10 mg by mouth every 4 (four) hours as needed for moderate pain or severe pain.     oxymorphone 40 MG 12 hr tablet  Commonly known as:  OPANA ER  Take 40 mg by mouth every 12 (twelve) hours.     prochlorperazine 10 MG tablet  Commonly known as:  COMPAZINE  Take 10 mg by mouth every 6 (six) hours as needed for nausea or vomiting.     SLOW-MAG PO  Take 1 tablet by mouth daily. 238 mg Calcium  143 mg magnesium  416 mg Chloride     sodium chloride 1 G tablet  Take 2 g by mouth 4 (four) times daily.     zolpidem 12.5 MG CR tablet  Commonly known as:  AMBIEN CR  Take 6.25 mg by mouth at bedtime as needed for sleep.          The results of significant diagnostics from this hospitalization (including imaging, microbiology, ancillary and laboratory) are listed below for reference.    Significant Diagnostic Studies: Dg  Chest 2 View  12/24/2014   CLINICAL DATA:  [place] in a parking lot today after losing her balance. Small cell lung cancer with metastases to the liver and bone.  EXAM: CHEST  2 VIEW  COMPARISON:  None.  FINDINGS: Irregular left hilar and perihilar mass measuring 7.9 cm in maximum diameter. Mildly enlarged cardiac silhouette. Post CABG changes. Right jugular port catheter tip in the superior vena cava. Mildly prominent interstitial markings. Right shoulder fixation anchors. Mild thoracic and upper lumbar spine degenerative changes.  IMPRESSION: 1.  7.9 cm left hilar and perihilar irregular mass, compatible with the history of lung cancer. 2. Mild cardiomegaly. 3. Mild chronic interstitial lung disease.   Electronically Signed   By: Claudie Revering M.D.   On: 12/24/2014 23:56   Ct Head Wo Contrast  12/24/2014   CLINICAL DATA:  Patient status post fall. Altered mental status. Cervical spine pain. No reported loss of consciousness. History of metastatic lung cancer.  EXAM: CT HEAD WITHOUT CONTRAST  CT CERVICAL SPINE WITHOUT CONTRAST  TECHNIQUE: Multidetector CT imaging of the head and cervical spine was performed following the standard protocol without intravenous contrast. Multiplanar CT image reconstructions of the cervical spine were also generated.  COMPARISON:  Cervical spine MRI 02/23/2005.  FINDINGS: CT HEAD FINDINGS  Extensive periventricular and subcortical white matter hypodensity compatible with chronic small vessel ischemic changes. Ventricles and sulci are prominent compatible with atrophy. No evidence for acute cortically based infarct, intracranial hemorrhage, mass lesion or mass-effect. Fluid and mucosal thickening within the left greater than right maxillary sinuses. Mastoid air cells are well aerated. Calvarium is intact. Soft tissue swelling overlying left frontal calvarium. No evidence for radiopaque foreign body.  CT CERVICAL SPINE FINDINGS  Straightening of the normal cervical lordosis. No evidence  for acute cervical spine fracture. Interval development of patchy sclerotic foci visualized throughout cervical spine with the largest within the C7 and T1 vertebral bodies. Degenerative disc disease most pronounced C5-6. Craniocervical junction is intact. Multiple low-attenuation nodules within the thyroid measuring up to. Focal consolidative opacity is demonstrated within the right lung apex. Patchy ground-glass opacity left upper lobe.  IMPRESSION: No acute intracranial process.  No acute cervical spine fracture.  Multiple sclerotic foci demonstrated throughout the visualized cervical spine compatible with osseous metastasis from known history of lung cancer.  Chronic small vessel ischemic changes.  Apical consolidation which is nonspecific and may be secondary to post treatment changes, an infectious or inflammatory process or metastatic disease. We the  These results were called by telephone at the time of interpretation on 12/24/2014 at 9:49 pm to Dr. Etta Quill , who verbally acknowledged these results.   Electronically Signed   By: Lovey Newcomer M.D.   On: 12/24/2014 21:59   Ct Cervical Spine Wo Contrast  12/24/2014   CLINICAL DATA:  Patient status post fall. Altered mental status. Cervical spine pain. No reported loss of consciousness. History of metastatic lung cancer.  EXAM: CT HEAD WITHOUT CONTRAST  CT CERVICAL SPINE WITHOUT CONTRAST  TECHNIQUE: Multidetector CT imaging of the head and cervical spine was performed following the standard protocol without intravenous contrast. Multiplanar CT image reconstructions of the cervical spine were also generated.  COMPARISON:  Cervical spine MRI 02/23/2005.  FINDINGS: CT HEAD FINDINGS  Extensive periventricular and subcortical white matter hypodensity compatible with chronic small vessel ischemic changes. Ventricles and sulci are prominent compatible with atrophy. No evidence for acute cortically based infarct, intracranial hemorrhage, mass lesion or mass-effect.  Fluid and mucosal thickening within the left greater than right maxillary sinuses. Mastoid air cells are well aerated. Calvarium is intact. Soft tissue swelling overlying left frontal calvarium. No evidence for radiopaque foreign body.  CT CERVICAL SPINE FINDINGS  Straightening of the normal cervical lordosis. No evidence for acute cervical spine fracture. Interval development of patchy sclerotic foci visualized throughout cervical spine with the largest within the C7 and T1 vertebral bodies. Degenerative disc disease most pronounced C5-6. Craniocervical junction is intact. Multiple low-attenuation nodules within the thyroid measuring up to. Focal consolidative opacity is  demonstrated within the right lung apex. Patchy ground-glass opacity left upper lobe.  IMPRESSION: No acute intracranial process.  No acute cervical spine fracture.  Multiple sclerotic foci demonstrated throughout the visualized cervical spine compatible with osseous metastasis from known history of lung cancer.  Chronic small vessel ischemic changes.  Apical consolidation which is nonspecific and may be secondary to post treatment changes, an infectious or inflammatory process or metastatic disease. We the  These results were called by telephone at the time of interpretation on 12/24/2014 at 9:49 pm to Dr. Etta Quill , who verbally acknowledged these results.   Electronically Signed   By: Lovey Newcomer M.D.   On: 12/24/2014 21:59    Microbiology: No results found for this or any previous visit (from the past 240 hour(s)).   Labs: Basic Metabolic Panel:  Recent Labs Lab 12/24/14 2114 12/25/14 0540 12/26/14 0900  NA 129* 125* 124*  K 4.1 4.3 3.7  CL 95* 90* 93*  CO2 '24 26 22  '$ GLUCOSE 118* 158* 111*  BUN '13 12 19  '$ CREATININE 0.67 0.62 0.58  CALCIUM 9.0 8.7* 8.5*   Liver Function Tests: No results for input(s): AST, ALT, ALKPHOS, BILITOT, PROT, ALBUMIN in the last 168 hours. No results for input(s): LIPASE, AMYLASE in the last  168 hours. No results for input(s): AMMONIA in the last 168 hours. CBC:  Recent Labs Lab 12/24/14 2114  WBC 8.5  NEUTROABS 6.6  HGB 12.2  HCT 36.0  MCV 78.4  PLT 152   Cardiac Enzymes: No results for input(s): CKTOTAL, CKMB, CKMBINDEX, TROPONINI in the last 168 hours. BNP: BNP (last 3 results) No results for input(s): BNP in the last 8760 hours.  ProBNP (last 3 results) No results for input(s): PROBNP in the last 8760 hours.  CBG: No results for input(s): GLUCAP in the last 168 hours.

## 2014-12-26 NOTE — Progress Notes (Signed)
Patient's family requested she be transferred to [facility].  MD gave order.  Bed avaliable on [place], room 909.  Called and gave report to [facility] RN .  Belongings sent with daughter.  [facility] called, waiting on arrival. Grace Parks

## 2014-12-26 NOTE — Progress Notes (Signed)
Dr. Ree Kida of internal medicine to [facility]. Transfer accepted. [phone]  Will do electronic emtala.  Leisa Lenz [facility] [phone]

## 2014-12-26 NOTE — Discharge Instructions (Signed)
Altered Mental Status Altered mental status most often refers to an abnormal change in your responsiveness and awareness. It can affect your speech, thought, mobility, memory, attention span, or alertness. It can range from slight confusion to complete unresponsiveness (coma). Altered mental status can be a sign of a serious underlying medical condition. Rapid evaluation and medical treatment is necessary for patients having an altered mental status. CAUSES   Low blood sugar (hypoglycemia) or diabetes.  Severe loss of body fluids (dehydration) or a body salt (electrolyte) imbalance.  A stroke or other neurologic problem, such as dementia or delirium.  A head injury or tumor.  A drug or alcohol overdose.  Exposure to toxins or poisons.  Depression, anxiety, and stress.  A low oxygen level (hypoxia).  An infection.  Blood loss.  Twitching or shaking (seizure).  Heart problems, such as heart attack or heart rhythm problems (arrhythmias).  A body temperature that is too low or too high (hypothermia or hyperthermia). DIAGNOSIS  A diagnosis is based on your history, symptoms, physical and neurologic examinations, and diagnostic tests. Diagnostic tests may include:  Measurement of your blood pressure, pulse, breathing, and oxygen levels (vital signs).  Blood tests.  Urine tests.  X-ray exams.  A computerized magnetic scan (magnetic resonance imaging, MRI).  A computerized X-ray scan (computed tomography, CT scan). TREATMENT  Treatment will depend on the cause. Treatment may include:  Management of an underlying medical or mental health condition.  Critical care or support in the hospital. [place]   Only take over-the-counter or prescription medicines for pain, discomfort, or fever as directed by your caregiver.  Manage underlying conditions as directed by your caregiver.  Eat a healthy, well-balanced diet to maintain strength.  Join a support group or  prevention program to cope with the condition or trauma that caused the altered mental status. Ask your caregiver to help choose a program that works for you.  Follow up with your caregiver for further examination, therapy, or testing as directed. SEEK MEDICAL CARE IF:   You feel unwell or have chills.  You or your family notice a change in your behavior or your alertness.  You have trouble following your caregiver's treatment plan.  You have questions or concerns. SEEK IMMEDIATE MEDICAL CARE IF:   You have a rapid heartbeat or have chest pain.  You have difficulty breathing.  You have a fever.  You have a headache with a stiff neck.  You cough up blood.  You have blood in your urine or stool.  You have severe agitation or confusion. MAKE SURE YOU:   Understand these instructions.  Will watch your condition.  Will get help right away if you are not doing well or get worse. Document Released: 09/06/2009 Document Revised: 06/11/2011 Document Reviewed: 09/06/2009 [facility] Patient Information 2015 [place], [place]. This information is not intended to replace advice given to you by your health care provider. Make sure you discuss any questions you have with your health care provider. Hyponatremia  Hyponatremia is when the amount of salt (sodium) in your blood is too low. When sodium levels are low, your cells will absorb extra water and swell. The swelling happens throughout the body, but it mostly affects the brain. Severe brain swelling (cerebral edema), seizures, or coma can happen.  CAUSES   Heart, kidney, or liver problems.  Thyroid problems.  Adrenal gland problems.  Severe vomiting and diarrhea.  Certain medicines or illegal drugs.  Dehydration.  Drinking too much water.  Low-sodium  diet. SYMPTOMS   Nausea and vomiting.  Confusion.  Lethargy.  Agitation.  Headache.  Twitching or shaking (seizures).  Unconsciousness.  Appetite loss.  Muscle  weakness and cramping. DIAGNOSIS  Hyponatremia is identified by a simple blood test. Your caregiver will perform a history and physical exam to try to find the cause and type of hyponatremia. Other tests may be needed to measure the amount of sodium in your blood and urine. TREATMENT  Treatment will depend on the cause.   Fluids may be given through the vein (IV).  Medicines may be used to correct the sodium imbalance. If medicines are causing the problem, they will need to be adjusted.  Water or fluid intake may be restricted to restore proper balance. The speed of correcting the sodium problem is very important. If the problem is corrected too fast, nerve damage (sometimes unchangeable) can happen. HOME CARE INSTRUCTIONS   Only take medicines as directed by your caregiver. Many medicines can make hyponatremia worse. Discuss all your medicines with your caregiver.  Carefully follow any recommended diet, including any fluid restrictions.  You may be asked to repeat lab tests. Follow these directions.  Avoid alcohol and recreational drugs. SEEK MEDICAL CARE IF:   You develop worsening nausea, fatigue, headache, confusion, or weakness.  Your original hyponatremia symptoms return.  You have problems following the recommended diet. SEEK IMMEDIATE MEDICAL CARE IF:   You have a seizure.  You faint.  You have ongoing diarrhea or vomiting. MAKE SURE YOU:   Understand these instructions.  Will watch your condition.  Will get help right away if you are not doing well or get worse. Document Released: 03/09/2002 Document Revised: 06/11/2011 Document Reviewed: 09/03/2010 [facility] Patient Information 2015 [place], [place]. This information is not intended to replace advice given to you by your health care provider. Make sure you discuss any questions you have with your health care provider. Urinary Tract Infection Urinary tract infections (UTIs) can develop anywhere along your urinary  tract. Your urinary tract is your body's drainage system for removing wastes and extra water. Your urinary tract includes two kidneys, two ureters, a bladder, and a urethra. Your kidneys are a pair of bean-shaped organs. Each kidney is about the size of your fist. They are located below your ribs, one on each side of your spine. CAUSES Infections are caused by microbes, which are microscopic organisms, including fungi, viruses, and bacteria. These organisms are so small that they can only be seen through a microscope. Bacteria are the microbes that most commonly cause UTIs. SYMPTOMS  Symptoms of UTIs may vary by age and gender of the patient and by the location of the infection. Symptoms in young women typically include a frequent and intense urge to urinate and a painful, burning feeling in the bladder or urethra during urination. Older women and men are more likely to be tired, shaky, and weak and have muscle aches and abdominal pain. A fever may mean the infection is in your kidneys. Other symptoms of a kidney infection include pain in your back or sides below the ribs, nausea, and vomiting. DIAGNOSIS To diagnose a UTI, your caregiver will ask you about your symptoms. Your caregiver also will ask to provide a urine sample. The urine sample will be tested for bacteria and white blood cells. White blood cells are made by your body to help fight infection. TREATMENT  Typically, UTIs can be treated with medication. Because most UTIs are caused by a bacterial infection, they usually can  be treated with the use of antibiotics. The choice of antibiotic and length of treatment depend on your symptoms and the type of bacteria causing your infection. HOME CARE INSTRUCTIONS  If you were prescribed antibiotics, take them exactly as your caregiver instructs you. Finish the medication even if you feel better after you have only taken some of the medication.  Drink enough water and fluids to keep your urine clear  or pale yellow.  Avoid caffeine, tea, and carbonated beverages. They tend to irritate your bladder.  Empty your bladder often. Avoid holding urine for long periods of time.  Empty your bladder before and after sexual intercourse.  After a bowel movement, women should cleanse from front to back. Use each tissue only once. SEEK MEDICAL CARE IF:   You have back pain.  You develop a fever.  Your symptoms do not begin to resolve within 3 days. SEEK IMMEDIATE MEDICAL CARE IF:   You have severe back pain or lower abdominal pain.  You develop chills.  You have nausea or vomiting.  You have continued burning or discomfort with urination. MAKE SURE YOU:   Understand these instructions.  Will watch your condition.  Will get help right away if you are not doing well or get worse. Document Released: 12/27/2004 Document Revised: 09/18/2011 Document Reviewed: 04/27/2011 [facility] Patient Information 2015 [place], [place]. This information is not intended to replace advice given to you by your health care provider. Make sure you discuss any questions you have with your health care provider.

## 2014-12-27 MED FILL — Morphine Sulfate Inj 10 MG/ML: INTRAMUSCULAR | Qty: 1 | Status: AC

## 2014-12-27 NOTE — Progress Notes (Signed)
Pt alert x2, will transfer to [facility]. Report given to Donnella Sham RN at [phone], will transfer to [place]. No c/o pain will continue to assess. Jeanie Sewer, RN 12:21 AM 12/27/2014

## 2014-12-31 LAB — CBC AND DIFFERENTIAL
HCT: 28 % — AB (ref 36–46)
Hemoglobin: 8.9 g/dL — AB (ref 12.0–16.0)
Platelets: 207 10*3/uL (ref 150–399)
WBC: 4.8 10^3/mL

## 2014-12-31 LAB — BASIC METABOLIC PANEL
BUN: 19 mg/dL (ref 4–21)
CREATININE: 0.5 mg/dL (ref <=1.1)
Glucose: 98 mg/dL
POTASSIUM: 3.8 mmol/L (ref 3.4–5.3)
Sodium: 134 mmol/L — AB (ref 137–147)

## 2015-01-03 ENCOUNTER — Non-Acute Institutional Stay (SKILLED_NURSING_FACILITY): Payer: Medicare Other | Admitting: Adult Health

## 2015-01-03 ENCOUNTER — Encounter: Payer: Self-pay | Admitting: Adult Health

## 2015-01-03 DIAGNOSIS — E871 Hypo-osmolality and hyponatremia: Secondary | ICD-10-CM

## 2015-01-03 DIAGNOSIS — G4700 Insomnia, unspecified: Secondary | ICD-10-CM

## 2015-01-03 DIAGNOSIS — F419 Anxiety disorder, unspecified: Secondary | ICD-10-CM | POA: Diagnosis not present

## 2015-01-03 DIAGNOSIS — D62 Acute posthemorrhagic anemia: Secondary | ICD-10-CM | POA: Insufficient documentation

## 2015-01-03 DIAGNOSIS — S72142S Displaced intertrochanteric fracture of left femur, sequela: Secondary | ICD-10-CM

## 2015-01-03 DIAGNOSIS — K5900 Constipation, unspecified: Secondary | ICD-10-CM | POA: Diagnosis not present

## 2015-01-03 DIAGNOSIS — S72142A Displaced intertrochanteric fracture of left femur, initial encounter for closed fracture: Secondary | ICD-10-CM | POA: Insufficient documentation

## 2015-01-03 DIAGNOSIS — R112 Nausea with vomiting, unspecified: Secondary | ICD-10-CM

## 2015-01-03 DIAGNOSIS — E222 Syndrome of inappropriate secretion of antidiuretic hormone: Secondary | ICD-10-CM | POA: Diagnosis not present

## 2015-01-03 DIAGNOSIS — C3490 Malignant neoplasm of unspecified part of unspecified bronchus or lung: Secondary | ICD-10-CM | POA: Diagnosis not present

## 2015-01-03 NOTE — Progress Notes (Signed)
Patient ID: Grace Parks, female   DOB: [birth year], 54 y.o.   MRN: [id number]    DATE:  01/03/2015 MRN:  [id number]  BIRTHDAY: July 23, 1960  Facility:  Nursing Home Location:  [place] Room Number: 1660-Y  LEVEL OF CARE:  [id number])  Contact Information    Name Relation Home Work [place]  [id number]     [place]    [phone]      Chief Complaint  Patient presents with  . Hospitalization Follow-up    Left hip intratrochanteric fracture S/P left hip intramedullary nail, anemia, small cell carcinoma of lung, hypomagnesemia, anxiety, nausea, constipation, hyponatremia/SIADH and insomnia    HISTORY OF PRESENT ILLNESS:  This is a 54 year old female who was been admitted to [facility] on 12/31/14 from [facility]. She has medical history of small cell lung cancer stage IV and undergoing third line palliative treatment with nivolumab (last dose 9/07). 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 She went to the hospital due to fall in a shopping center parking lot and had confusion. She hip her left side of her head. CT head was negative for any acute process or metastatic disease.   She was treated in the hospital for possible UTI with ceftriaxone which was later discontinued due to to urine culture not showing any growth. Her sodium was initially 127 and then declined to 125 on her second day hospitalization. Her sodium dipped to as low as 121 in the past couple of years. She complained of left hip pain and imaging showed left hip intertrochanteric fracture for which she had left hip intramedullary nail on 9/27. She was given salt tablets and fluid restricted to 1,000 ML daily  She has been admitted for short-term rehabilitation.  PAST MEDICAL  HISTORY:  Past Medical History  Diagnosis Date  . 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 Diabetes mellitus without complication ([place])   . Hyponatremia   . Stroke ([place])   . TIA (transient ischemic attack)      CURRENT MEDICATIONS: Reviewed  Patient's Medications  New Prescriptions   No medications on file  Previous Medications   CALCIUM-VITAMIN D (OSCAL WITH D) 500-200 MG-UNIT TABLET    Take 1 tablet by mouth daily.   DIPHENHYDRAMINE (SOMINEX) 25 MG TABLET    Take 25 mg by mouth daily as needed for allergies or sleep.   ENOXAPARIN (LOVENOX) 40 MG/0.4ML INJECTION    Inject 40 mg into the skin daily. X 19 days   IRON POLYSACCHARIDES (NIFEREX) 150 MG CAPSULE    Take 150 mg by mouth 2 (two) times daily.   LORAZEPAM (ATIVAN) 0.5 MG TABLET    Take 0.5 mg by mouth every 6 (six) hours as needed for anxiety.   MAGNESIUM CHLORIDE (SLOW-MAG) 64 MG TBEC SR TABLET    Take 2 tablets by mouth 2 (two) times daily.   NIVOLUMAB 3 MG/KG IN SODIUM CHLORIDE 0.9 % 100 ML    Inject 3 mg/kg into the vein every 14 (fourteen) days. Opdivo   ONDANSETRON (ZOFRAN) 4 MG TABLET    Take 4 mg by mouth every 8 (eight) hours as needed for nausea or vomiting.   OXYCODONE (OXY IR/ROXICODONE)  5 MG IMMEDIATE RELEASE TABLET    Take 10 mg by mouth every 4 (four) hours as needed for moderate pain or severe pain.   OXYCODONE (OXYCONTIN) 20 MG T12A 12 HR TABLET    Take 20 mg by mouth every 12 (twelve) hours.   POLYETHYLENE GLYCOL (MIRALAX / GLYCOLAX) PACKET    Take 17 g by mouth 2 (two) times daily.   PROCHLORPERAZINE (COMPAZINE) 10 MG TABLET    Take 10 mg by mouth every 6 (six) hours as needed for nausea or vomiting.   SENNOSIDES-DOCUSATE SODIUM (SENOKOT-S) 8.6-50 MG TABLET    Take 2 tablets by mouth 2 (two) times daily.   SODIUM CHLORIDE 1 G TABLET    Take 2 g by mouth 4 (four) times daily.   ZOLPIDEM (AMBIEN CR) 12.5 MG CR TABLET    Take 6.25 mg by mouth at bedtime as needed for sleep.  Modified  Medications   No medications on file  Discontinued Medications   CEFTRIAXONE 1 G IN DEXTROSE 5 % 50 ML    Inject 1 g into the vein daily.   MAGNESIUM CL-CALCIUM CARBONATE (SLOW-MAG PO)    Take 1 tablet by mouth daily. 238 mg Calcium  143 mg magnesium  416 mg Chloride   OXYMORPHONE (OPANA ER) 40 MG 12 HR TABLET    Take 40 mg by mouth every 12 (twelve) hours.     Allergies  Allergen Reactions  . Bactrim [Sulfamethoxazole-Trimethoprim] Anaphylaxis  . Erythromycin Anaphylaxis  . Cefaclor Itching  . Flagyl [Metronidazole] Other (See Comments)    Reaction: unknown  . Latex Other (See Comments)    Reaction: unknown   . Albuterol Palpitations  . Levaquin [Levofloxacin] Rash  . Tetracyclines & Related Rash     REVIEW OF SYSTEMS:  GENERAL: no change in appetite, no fatigue, no weight changes, no fever, chills or weakness EYES: Denies change in vision, dry eyes, eye pain, itching or discharge EARS: Denies change in hearing, ringing in ears, or earache NOSE: Denies nasal congestion or epistaxis MOUTH and THROAT: Denies oral discomfort, gingival pain or bleeding, pain from teeth or hoarseness   RESPIRATORY: no cough, SOB, DOE, wheezing, hemoptysis CARDIAC: no chest pain, edema or palpitations GI: no abdominal pain, diarrhea, heart burn, nausea or vomiting,+ constipation GU: Denies dysuria, frequency, hematuria, incontinence, or discharge PSYCHIATRIC: Denies feeling of depression or anxiety. No report of hallucinations, insomnia, paranoia, or agitation   PHYSICAL EXAMINATION  GENERAL APPEARANCE: Well nourished. In no acute distress. Normal body habitus SKIN:  Left hip surgical incision with dry dressing, no erythema HEAD: Normal in size and contour. No evidence of trauma EYES: Lids open and close normally. No blepharitis, entropion or ectropion. PERRL. Conjunctivae are clear and sclerae are white. Lenses are without opacity EARS: Pinnae are normal. Patient hears normal voice tunes of  the examiner MOUTH and THROAT: Lips are without lesions. Oral mucosa is moist and without lesions. Tongue is normal in shape, size, and color and without lesions NECK: supple, trachea midline, no neck masses, no thyroid tenderness, no thyromegaly LYMPHATICS: no LAN in the neck, no supraclavicular LAN RESPIRATORY: breathing is even & unlabored, BS CTAB; right chest Port-A-Cath CARDIAC: RRR, no murmur,no extra heart sounds, no edema GI: abdomen soft, normal BS, no masses, no tenderness, no hepatomegaly, no splenomegaly EXTREMITIES:  Able to move X 4 extremities PSYCHIATRIC: Alert and oriented X 3. Affect and behavior are appropriate  LABS/RADIOLOGY: Labs reviewed: Basic Metabolic Panel:  Recent Labs  12/24/14 2114 12/25/14 0540 12/26/14  0900  NA 129* 125* 124*  K 4.1 4.3 3.7  CL 95* 90* 93*  CO2 '24 26 22  '$ GLUCOSE 118* 158* 111*  BUN '13 12 19  '$ CREATININE 0.67 0.62 0.58  CALCIUM 9.0 8.7* 8.5*   CBC:  Recent Labs  12/24/14 2114  WBC 8.5  NEUTROABS 6.6  HGB 12.2  HCT 36.0  MCV 78.4  PLT 152     Dg Chest 2 View  12/24/2014   CLINICAL DATA:  [place] in a parking lot today after losing her balance. Small cell lung cancer with metastases to the liver and bone.  EXAM: CHEST  2 VIEW  COMPARISON:  None.  FINDINGS: Irregular left hilar and perihilar mass measuring 7.9 cm in maximum diameter. Mildly enlarged cardiac silhouette. Post CABG changes. Right jugular port catheter tip in the superior vena cava. Mildly prominent interstitial markings. Right shoulder fixation anchors. Mild thoracic and upper lumbar spine degenerative changes.  IMPRESSION: 1. 7.9 cm left hilar and perihilar irregular mass, compatible with the history of lung cancer. 2. Mild cardiomegaly. 3. Mild chronic interstitial lung disease.   Electronically Signed   By: Claudie Revering M.D.   On: 12/24/2014 23:56   Ct Head Wo Contrast  12/24/2014   CLINICAL DATA:  Patient status post fall. Altered mental status. Cervical spine  pain. No reported loss of consciousness. History of metastatic lung cancer.  EXAM: CT HEAD WITHOUT CONTRAST  CT CERVICAL SPINE WITHOUT CONTRAST  TECHNIQUE: Multidetector CT imaging of the head and cervical spine was performed following the standard protocol without intravenous contrast. Multiplanar CT image reconstructions of the cervical spine were also generated.  COMPARISON:  Cervical spine MRI 02/23/2005.  FINDINGS: CT HEAD FINDINGS  Extensive periventricular and subcortical white matter hypodensity compatible with chronic small vessel ischemic changes. Ventricles and sulci are prominent compatible with atrophy. No evidence for acute cortically based infarct, intracranial hemorrhage, mass lesion or mass-effect. Fluid and mucosal thickening within the left greater than right maxillary sinuses. Mastoid air cells are well aerated. Calvarium is intact. Soft tissue swelling overlying left frontal calvarium. No evidence for radiopaque foreign body.  CT CERVICAL SPINE FINDINGS  Straightening of the normal cervical lordosis. No evidence for acute cervical spine fracture. Interval development of patchy sclerotic foci visualized throughout cervical spine with the largest within the C7 and T1 vertebral bodies. Degenerative disc disease most pronounced C5-6. Craniocervical junction is intact. Multiple low-attenuation nodules within the thyroid measuring up to. Focal consolidative opacity is demonstrated within the right lung apex. Patchy ground-glass opacity left upper lobe.  IMPRESSION: No acute intracranial process.  No acute cervical spine fracture.  Multiple sclerotic foci demonstrated throughout the visualized cervical spine compatible with osseous metastasis from known history of lung cancer.  Chronic small vessel ischemic changes.  Apical consolidation which is nonspecific and may be secondary to post treatment changes, an infectious or inflammatory process or metastatic disease. We the  These results were called by  telephone at the time of interpretation on 12/24/2014 at 9:49 pm to Dr. Etta Quill , who verbally acknowledged these results.   Electronically Signed   By: Lovey Newcomer M.D.   On: 12/24/2014 21:59   Ct Cervical Spine Wo Contrast  12/24/2014   CLINICAL DATA:  Patient status post fall. Altered mental status. Cervical spine pain. No reported loss of consciousness. History of metastatic lung cancer.  EXAM: CT HEAD WITHOUT CONTRAST  CT CERVICAL SPINE WITHOUT CONTRAST  TECHNIQUE: Multidetector CT imaging of the head and cervical spine  was performed following the standard protocol without intravenous contrast. Multiplanar CT image reconstructions of the cervical spine were also generated.  COMPARISON:  Cervical spine MRI 02/23/2005.  FINDINGS: CT HEAD FINDINGS  Extensive periventricular and subcortical white matter hypodensity compatible with chronic small vessel ischemic changes. Ventricles and sulci are prominent compatible with atrophy. No evidence for acute cortically based infarct, intracranial hemorrhage, mass lesion or mass-effect. Fluid and mucosal thickening within the left greater than right maxillary sinuses. Mastoid air cells are well aerated. Calvarium is intact. Soft tissue swelling overlying left frontal calvarium. No evidence for radiopaque foreign body.  CT CERVICAL SPINE FINDINGS  Straightening of the normal cervical lordosis. No evidence for acute cervical spine fracture. Interval development of patchy sclerotic foci visualized throughout cervical spine with the largest within the C7 and T1 vertebral bodies. Degenerative disc disease most pronounced C5-6. Craniocervical junction is intact. Multiple low-attenuation nodules within the thyroid measuring up to. Focal consolidative opacity is demonstrated within the right lung apex. Patchy ground-glass opacity left upper lobe.  IMPRESSION: No acute intracranial process.  No acute cervical spine fracture.  Multiple sclerotic foci demonstrated throughout the  visualized cervical spine compatible with osseous metastasis from known history of lung cancer.  Chronic small vessel ischemic changes.  Apical consolidation which is nonspecific and may be secondary to post treatment changes, an infectious or inflammatory process or metastatic disease. We the  These results were called by telephone at the time of interpretation on 12/24/2014 at 9:49 pm to Dr. Etta Quill , who verbally acknowledged these results.   Electronically Signed   By: Lovey Newcomer M.D.   On: 12/24/2014 21:59    ASSESSMENT/PLAN:  Left hip intertrochanteric fracture S/P left hip intramedullary nail - for rehabilitation; continue Lovenox 40 mg subcutaneous daily X 19 days for DVT prophylaxis; OxyContin ER 20 mg 1 tab by mouth every 12 hours and oxycodone 5 mg IR 2 tabs = 10 mg by mouth every 4 hours when necessary for pain; follow-up with orthopedic surgeon in 2 weeks  Anemia, acute blood loss - hemoglobin 11.1; continue Nu-Iron 150 mg 1 capsule by mouth twice a day; check CBC  Small cell carcinoma of lung - follow-up with oncology on 10/19  Hypomagnesemia - continue magnesium chloride 64 mg SR 2 tabs by mouth twice a day; check magnesium level  Anxiety - mood is stable; continue Ativan 0.5 mg 1 tab by mouth every 6 hours when necessary  Nausea - continue Zofran 4 mg 1 tab by mouth every 8 hours when necessary and Compazine 10 mg 1 tab by mouth every 6 hours when necessary  Constipation - increase MiraLAX to 17 g by mouth 3 times a day and senna S8.6-50 mg 2 tabs by mouth twice a day  Hyponatremia/SIADH - sodium 123; continue sodium chloride 1000 mg take 2 tabs = 2000 mg by mouth 4 times a day; check BMP  Insomnia - continue Ambien CR 12.5 mg 1 tab by mouth daily at bedtime when necessary    Goals of care:  Short-term rehabilitation    [facility], [facility] [phone]

## 2015-01-06 ENCOUNTER — Non-Acute Institutional Stay (SKILLED_NURSING_FACILITY): Payer: Medicare Other | Admitting: Internal Medicine

## 2015-01-06 DIAGNOSIS — E222 Syndrome of inappropriate secretion of antidiuretic hormone: Secondary | ICD-10-CM

## 2015-01-06 DIAGNOSIS — K5901 Slow transit constipation: Secondary | ICD-10-CM | POA: Diagnosis not present

## 2015-01-06 DIAGNOSIS — D62 Acute posthemorrhagic anemia: Secondary | ICD-10-CM

## 2015-01-06 DIAGNOSIS — R2681 Unsteadiness on feet: Secondary | ICD-10-CM | POA: Diagnosis not present

## 2015-01-06 DIAGNOSIS — C3490 Malignant neoplasm of unspecified part of unspecified bronchus or lung: Secondary | ICD-10-CM | POA: Diagnosis not present

## 2015-01-06 DIAGNOSIS — N390 Urinary tract infection, site not specified: Secondary | ICD-10-CM | POA: Diagnosis not present

## 2015-01-06 DIAGNOSIS — G4700 Insomnia, unspecified: Secondary | ICD-10-CM

## 2015-01-06 DIAGNOSIS — S72142S Displaced intertrochanteric fracture of left femur, sequela: Secondary | ICD-10-CM

## 2015-01-06 NOTE — Progress Notes (Signed)
Patient ID: Grace Parks, female   DOB: [birth year], 54 y.o.   MRN: [id number]     [facility]   PCP: No primary care provider on file.  Code Status: full code  Allergies  Allergen Reactions  . Bactrim [Sulfamethoxazole-Trimethoprim] Anaphylaxis  . Erythromycin Anaphylaxis  . Cefaclor Itching  . Flagyl [Metronidazole] Other (See Comments)    Reaction: unknown  . Latex Other (See Comments)    Reaction: unknown   . Albuterol Palpitations  . Levaquin [Levofloxacin] Rash  . Tetracyclines & Related Rash    Chief Complaint  Patient presents with  . New Admit To SNF     HPI:  54 y.o. 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 CT head was negative for any acute process or metastatic disease. She was treated for UTI with ceftriaxone in the hospital. She was given salt tablets for her low sodium and had fluid restriction. She was also noted to have left hip intertrochanteric fracture and underwent left hip intramedullary nailing on 12/28/14. 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 She is seen in her room today. She has not had a bowel movement since being in the facility. At home she had 1-2 bowel movement a week. She has worked with therapy team today. Her pain is under control with current pain regimen.  Review of Systems:  Constitutional: Negative for fever, chills, diaphoresis.  HENT: Negative for headache, congestion, nasal discharge, difficulty swallowing.   Eyes: Negative for eye pain, blurred vision, double vision and discharge.  Respiratory: Negative for cough, shortness of breath and wheezing.   Cardiovascular: Negative for chest pain, palpitations, leg swelling.  Gastrointestinal: Negative for heartburn, nausea, vomiting, abdominal pain. Has been constipated Genitourinary: Negative for dysuria.    Musculoskeletal: Negative for back pain, falls Skin: Negative for itching, rash.  Neurological: Negative for dizziness Psychiatric/Behavioral: Negative for depression   Past Medical History  Diagnosis Date  . Cancer ([place])     small cell lung ca, with mets to liver and bone  . Diabetes mellitus without complication ([place])   . Hyponatremia   . Stroke ([place])   . TIA (transient ischemic attack)    Past Surgical History  Procedure Laterality Date  . Coronary artery bypass graft    . Abdominal hysterectomy     Social History:   reports that she has never smoked. She does not have any smokeless tobacco history on file. She reports that she does not drink alcohol. Her drug history is not on file.  No family history on file.  Medications:   Medication List       This list is accurate as of: 01/06/15 10:18 AM.  Always use your most recent med list.               lidocaine-prilocaine cream  Commonly known as:  EMLA  1 tube ( 5 g ) apply hour prior to cath accesess     LORazepam 0.5 MG tablet  Commonly known as:  ATIVAN  Take 0.5 mg by mouth every 6 (six) hours as needed for anxiety.     nivolumab 3 mg/kg in sodium chloride 0.9 % 100 mL  Inject 3 mg/kg into the vein every 14 (fourteen) days. Opdivo     ondansetron 4 MG tablet  Commonly known as:  ZOFRAN  Take 4 mg by mouth every 8 (  eight) hours as needed for nausea or vomiting.     OxyCODONE 20 mg T12a 12 hr tablet  Commonly known as:  OXYCONTIN  Take 20 mg by mouth every 12 (twelve) hours.     oxycodone 5 MG capsule  Commonly known as:  OXY-IR  Take 2 tablets (10 mg ) by mouth every 4 hours as needed for pain     polyethylene glycol packet  Commonly known as:  MIRALAX / GLYCOLAX  Take 17 g by mouth 2 (two) times daily.     prochlorperazine 10 MG tablet  Commonly known as:  COMPAZINE  Take 10 mg by mouth every 6 (six) hours as needed for nausea or vomiting.     sennosides-docusate sodium 8.6-50 MG tablet   Commonly known as:  SENOKOT-S  Take 2 tablets by mouth nightly     sodium chloride 1 G tablet  Take 2 g by mouth 4 (four) times daily.     zolpidem 12.5 MG CR tablet  Commonly known as:  AMBIEN CR  Take 1 ( 12.5 mg ) tablet by mouth nightly as needed for up to 30 days for sleep         Physical Exam: 109/60, 71/min, 18/min, 99.2, 98%  General- adult female, frail and thin built, in no acute distress Head- normocephalic, atraumatic, hair loss +, left lateral forehead has laceration with 3 sutures Nose- normal nasal mucosa, no maxillary or frontal sinus tenderness, no nasal discharge Throat- moist mucus membrane  Eyes- PERRLA, EOMI, no pallor, no icterus, no discharge, normal conjunctiva, normal sclera Neck- no cervical lymphadenopathy Cardiovascular- normal s1,s2, , palpable dorsalis pedis and radial pulses, 1+ leg edema Respiratory- bilateral clear to auscultation, no wheeze, no rhonchi, no crackles, no use of accessory muscles Abdomen- bowel sounds present, soft, non tender Musculoskeletal- able to move all 4 extremities, limited left leg range of motion  Neurological- no focal deficit, alert and oriented to person, place and time Skin- warm and dry, left hip surgical incision with sutures in place, clean and dry dressing Psychiatry- normal mood and affect    Labs reviewed: Basic Metabolic Panel:  Recent Labs  12/24/14 2114 12/25/14 0540 12/26/14 0900 12/31/14  NA 129* 125* 124* 134*  K 4.1 4.3 3.7 3.8  CL 95* 90* 93*  --   CO2 '24 26 22  '$ --   GLUCOSE 118* 158* 111*  --   BUN '13 12 19 19  '$ CREATININE 0.67 0.62 0.58 0.5  CALCIUM 9.0 8.7* 8.5*  --    CBC:  Recent Labs  12/24/14 2114 12/31/14  WBC 8.5 4.8  NEUTROABS 6.6  --   HGB 12.2 8.9*  HCT 36.0 28*  MCV 78.4  --   PLT 152 207    Radiological Exams: Dg Chest 2 View  12/24/2014   CLINICAL DATA:  [place] in a parking lot today after losing her balance. Small cell lung cancer with metastases to the liver  and bone.  EXAM: CHEST  2 VIEW  COMPARISON:  None.  FINDINGS: Irregular left hilar and perihilar mass measuring 7.9 cm in maximum diameter. Mildly enlarged cardiac silhouette. Post CABG changes. Right jugular port catheter tip in the superior vena cava. Mildly prominent interstitial markings. Right shoulder fixation anchors. Mild thoracic and upper lumbar spine degenerative changes.  IMPRESSION: 1. 7.9 cm left hilar and perihilar irregular mass, compatible with the history of lung cancer. 2. Mild cardiomegaly. 3. Mild chronic interstitial lung disease.   Electronically Signed   By: Remo Lipps  Joneen Caraway M.D.   On: 12/24/2014 23:56   Ct Head Wo Contrast  12/24/2014   CLINICAL DATA:  Patient status post fall. Altered mental status. Cervical spine pain. No reported loss of consciousness. History of metastatic lung cancer.  EXAM: CT HEAD WITHOUT CONTRAST  CT CERVICAL SPINE WITHOUT CONTRAST  TECHNIQUE: Multidetector CT imaging of the head and cervical spine was performed following the standard protocol without intravenous contrast. Multiplanar CT image reconstructions of the cervical spine were also generated.  COMPARISON:  Cervical spine MRI 02/23/2005.  FINDINGS: CT HEAD FINDINGS  Extensive periventricular and subcortical white matter hypodensity compatible with chronic small vessel ischemic changes. Ventricles and sulci are prominent compatible with atrophy. No evidence for acute cortically based infarct, intracranial hemorrhage, mass lesion or mass-effect. Fluid and mucosal thickening within the left greater than right maxillary sinuses. Mastoid air cells are well aerated. Calvarium is intact. Soft tissue swelling overlying left frontal calvarium. No evidence for radiopaque foreign body.  CT CERVICAL SPINE FINDINGS  Straightening of the normal cervical lordosis. No evidence for acute cervical spine fracture. Interval development of patchy sclerotic foci visualized throughout cervical spine with the largest within the C7  and T1 vertebral bodies. Degenerative disc disease most pronounced C5-6. Craniocervical junction is intact. Multiple low-attenuation nodules within the thyroid measuring up to. Focal consolidative opacity is demonstrated within the right lung apex. Patchy ground-glass opacity left upper lobe.  IMPRESSION: No acute intracranial process.  No acute cervical spine fracture.  Multiple sclerotic foci demonstrated throughout the visualized cervical spine compatible with osseous metastasis from known history of lung cancer.  Chronic small vessel ischemic changes.  Apical consolidation which is nonspecific and may be secondary to post treatment changes, an infectious or inflammatory process or metastatic disease. We the  These results were called by telephone at the time of interpretation on 12/24/2014 at 9:49 pm to Dr. Etta Quill , who verbally acknowledged these results.   Electronically Signed   By: Lovey Newcomer M.D.   On: 12/24/2014 21:59   Ct Cervical Spine Wo Contrast  12/24/2014   CLINICAL DATA:  Patient status post fall. Altered mental status. Cervical spine pain. No reported loss of consciousness. History of metastatic lung cancer.  EXAM: CT HEAD WITHOUT CONTRAST  CT CERVICAL SPINE WITHOUT CONTRAST  TECHNIQUE: Multidetector CT imaging of the head and cervical spine was performed following the standard protocol without intravenous contrast. Multiplanar CT image reconstructions of the cervical spine were also generated.  COMPARISON:  Cervical spine MRI 02/23/2005.  FINDINGS: CT HEAD FINDINGS  Extensive periventricular and subcortical white matter hypodensity compatible with chronic small vessel ischemic changes. Ventricles and sulci are prominent compatible with atrophy. No evidence for acute cortically based infarct, intracranial hemorrhage, mass lesion or mass-effect. Fluid and mucosal thickening within the left greater than right maxillary sinuses. Mastoid air cells are well aerated. Calvarium is intact. Soft  tissue swelling overlying left frontal calvarium. No evidence for radiopaque foreign body.  CT CERVICAL SPINE FINDINGS  Straightening of the normal cervical lordosis. No evidence for acute cervical spine fracture. Interval development of patchy sclerotic foci visualized throughout cervical spine with the largest within the C7 and T1 vertebral bodies. Degenerative disc disease most pronounced C5-6. Craniocervical junction is intact. Multiple low-attenuation nodules within the thyroid measuring up to. Focal consolidative opacity is demonstrated within the right lung apex. Patchy ground-glass opacity left upper lobe.  IMPRESSION: No acute intracranial process.  No acute cervical spine fracture.  Multiple sclerotic foci demonstrated throughout the visualized cervical  spine compatible with osseous metastasis from known history of lung cancer.  Chronic small vessel ischemic changes.  Apical consolidation which is nonspecific and may be secondary to post treatment changes, an infectious or inflammatory process or metastatic disease. We the  These results were called by telephone at the time of interpretation on 12/24/2014 at 9:49 pm to Dr. Etta Quill , who verbally acknowledged these results.   Electronically Signed   By: Lovey Newcomer M.D.   On: 12/24/2014 21:59     Assessment/Plan  Unsteady gait Will have her work with physical therapy and occupational therapy team to help with gait training and muscle strengthening exercises.fall precautions. Skin care. Encourage to be out of bed.   Left hip intertrochanteric fracture  S/P left hip intramedullary nailing. Continue oxycontin er 20 mg bid with oxyIR 5 mg 2 tab q4h prn for breakthrough pain. Has f/u with orthopedics. Continue lovenox for dvt prophylaxis. Monitor cbc. Will have patient work with PT/OT as tolerated to regain strength and restore function.  Fall precautions are in place.  SIADH Continue salt tablet and fluid restriction of 1 l/day. Monitor  bmp  Blood loss anemia Post op, monitor cbc. Continue nu-iron 150 mg bid for now.  Small cell carcinoma of lung Monitor her breathing. follow-up with oncology on 01/19/15  UTI Completed rx and currently asymptomatic. Monitor clinically  Constipation Discontinue miralax, start linzess and continue senna s 2 tab bid, monitor for signs of obstruction  Hypomagnesemia  continue magnesium chloride supplement and monitor Mg level  Insomnia Continue ambien 12.5 mg qhs prn with ativan 0.5 mg q6h prn and monitor   Goals of care: short term rehabilitation   Labs/tests ordered: cbc in 1 week  Family/ staff Communication: reviewed care plan with patient and nursing supervisor    Blanchie Serve, MD  [facility] [phone] (Monday-Friday 8 am - 5 pm) [phone] (afterhours)

## 2015-01-17 ENCOUNTER — Non-Acute Institutional Stay (SKILLED_NURSING_FACILITY): Payer: Medicare Other | Admitting: Adult Health

## 2015-01-17 ENCOUNTER — Encounter: Payer: Self-pay | Admitting: Adult Health

## 2015-01-17 DIAGNOSIS — G4700 Insomnia, unspecified: Secondary | ICD-10-CM | POA: Diagnosis not present

## 2015-01-17 DIAGNOSIS — F419 Anxiety disorder, unspecified: Secondary | ICD-10-CM | POA: Diagnosis not present

## 2015-01-17 DIAGNOSIS — D62 Acute posthemorrhagic anemia: Secondary | ICD-10-CM

## 2015-01-17 DIAGNOSIS — C3490 Malignant neoplasm of unspecified part of unspecified bronchus or lung: Secondary | ICD-10-CM | POA: Diagnosis not present

## 2015-01-17 DIAGNOSIS — S72142S Displaced intertrochanteric fracture of left femur, sequela: Secondary | ICD-10-CM

## 2015-01-17 DIAGNOSIS — K5900 Constipation, unspecified: Secondary | ICD-10-CM | POA: Diagnosis not present

## 2015-01-17 DIAGNOSIS — E222 Syndrome of inappropriate secretion of antidiuretic hormone: Secondary | ICD-10-CM | POA: Diagnosis not present

## 2015-01-17 DIAGNOSIS — R112 Nausea with vomiting, unspecified: Secondary | ICD-10-CM

## 2015-01-17 DIAGNOSIS — E871 Hypo-osmolality and hyponatremia: Secondary | ICD-10-CM

## 2015-01-17 NOTE — Progress Notes (Signed)
Patient ID: Grace Parks, female   DOB: [birth year], 54 y.o.   MRN: [id number]    DATE:  01/17/2015   MRN:  [id number]  BIRTHDAY: 06-Oct-1960  Facility:  Nursing Home Location:  [place] Room Number: 1201-P  LEVEL OF CARE:  SNF [phone]  Contact Information    Name Relation Home Work [place] Father [phone]  [phone]   [place] Daughter   [phone]      Chief Complaint  Patient presents with  . Discharge Note    Left hip intratrochanteric fracture S/P left hip intramedullary nail, anemia, small cell carcinoma of lung, hypomagnesemia, anxiety, nausea, constipation, hyponatremia/SIADH and insomnia    HISTORY OF PRESENT ILLNESS:  This is a 54 year old female who is for discharge home with home health PT, OT, home health aide and nursing. DME: Rolling walker (58", 140 lbs) and 3 in 1 bedside commode. She has been [facility] 12/[facility] from [facility]. She has medical history of small cell lung cancer stage IV and undergoing third line palliative treatment with nivolumab (last dose 9/07). 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 She went to the hospital due to fall in [place] shopping center parking lot and had confusion. She hit her left side of her head. CT head was negative for any acute process or metastatic disease.   She was treated in the hospital for possible UTI with ceftriaxone which was later discontinued due to to urine culture not showing any growth. Her sodium was initially 127 and then declined to 125 on her second day hospitalization. Her sodium dipped to as low as 121 in the past couple of years. She complained of left hip pain and imaging showed left hip intertrochanteric fracture for which she had left hip  intramedullary nail on 9/27. She was given salt tablets and fluid restricted to 1,000 ML daily  Patient was admitted to this facility for short-term rehabilitation after the patient's recent hospitalization.  Patient has completed SNF rehabilitation and therapy has cleared the patient for discharge.  PAST MEDICAL HISTORY:  Past Medical History  Diagnosis Date  . 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 Diabetes mellitus without complication ([place])   . Hyponatremia   . Stroke ([place])   . TIA (transient ischemic attack)      CURRENT MEDICATIONS: Reviewed  Patient's Medications  New Prescriptions   No medications on file  Previous Medications   LIDOCAINE-PRILOCAINE (EMLA) CREAM    1 tube ( 5 g ) apply hour prior to cath accesess   LINACLOTIDE (LINZESS) 145 MCG CAPS CAPSULE    Take 145 mcg by mouth daily. 30 minutes prior to breakfast   LORAZEPAM (ATIVAN) 0.5 MG TABLET    Take 0.5 mg by mouth every 6 (six) hours as needed for anxiety.   MAGNESIUM CHLORIDE (SLOW-MAG) 64 MG TBEC SR TABLET    Take 2 tablets by mouth 2 (two) times daily.   NIVOLUMAB 3 MG/KG IN SODIUM CHLORIDE 0.9 % 100 ML    Inject 3 mg/kg into the vein every 14 (fourteen) days. Opdivo   ONDANSETRON (ZOFRAN) 4 MG TABLET    Take 4 mg by mouth every 8 (eight) hours  as needed for nausea or vomiting.   OXYCODONE (OXY-IR) 5 MG CAPSULE    Take 2 tablets (10 mg ) by mouth every 4 hours as needed for pain   OXYCODONE (OXYCONTIN) 20 MG T12A 12 HR TABLET    Take 20 mg by mouth every 12 (twelve) hours.   PROCHLORPERAZINE (COMPAZINE) 10 MG TABLET    Take 10 mg by mouth every 6 (six) hours as needed for nausea or vomiting.   SENNOSIDES-DOCUSATE SODIUM (SENOKOT-S) 8.6-50 MG TABLET    Take 2 tablets by mouth 2 (two) times daily.   SODIUM CHLORIDE 1 G TABLET    Take 2 g by mouth 4 (four) times daily.   ZOLPIDEM (AMBIEN CR) 12.5 MG CR TABLET    Take 1 ( 12.5 mg ) tablet by mouth nightly as needed for up to 30 days for  sleep  Modified Medications   No medications on file  Discontinued Medications   POLYETHYLENE GLYCOL (MIRALAX / GLYCOLAX) PACKET    Take 17 g by mouth 2 (two) times daily.   SENNOSIDES-DOCUSATE SODIUM (SENOKOT-S) 8.6-50 MG TABLET    Take 2 tablets by mouth nightly    Allergies  Allergen Reactions  . Bactrim [Sulfamethoxazole-Trimethoprim] Anaphylaxis  . Erythromycin Anaphylaxis  . Cefaclor Itching  . Flagyl [Metronidazole] Other (See Comments)    Reaction: unknown  . Latex Other (See Comments)    Reaction: unknown   . Albuterol Palpitations  . Levaquin [Levofloxacin] Rash  . Tetracyclines & Related Rash    REVIEW OF SYSTEMS:  GENERAL: no change in appetite, no fatigue, no weight changes, no fever, chills or weakness EYES: Denies change in vision, dry eyes, eye pain, itching or discharge EARS: Denies change in hearing, ringing in ears, or earache NOSE: Denies nasal congestion or epistaxis MOUTH and THROAT: Denies oral discomfort, gingival pain or bleeding, pain from teeth or hoarseness   RESPIRATORY: no cough, SOB, DOE, wheezing, hemoptysis CARDIAC: no chest pain, edema or palpitations GI: no abdominal pain, diarrhea, heart burn, nausea or vomiting GU: Denies dysuria, frequency, hematuria, incontinence, or discharge PSYCHIATRIC: Denies feeling of depression or anxiety. No report of hallucinations, insomnia, paranoia, or agitation   PHYSICAL EXAMINATION  GENERAL APPEARANCE: Well nourished. In no acute distress. Normal body habitus SKIN:  Left hip surgical incision is healed HEAD: Normal in size and contour. No evidence of trauma EYES: Lids open and close normally. No blepharitis, entropion or ectropion. PERRL. Conjunctivae are clear and sclerae are white. Lenses are without opacity EARS: Pinnae are normal. Patient hears normal voice tunes of the examiner MOUTH and THROAT: Lips are without lesions. Oral mucosa is moist and without lesions. Tongue is normal in shape, size, and  color and without lesions NECK: supple, trachea midline, no neck masses, no thyroid tenderness, no thyromegaly LYMPHATICS: no LAN in the neck, no supraclavicular LAN RESPIRATORY: breathing is even & unlabored, BS CTAB; right chest Port-A-Cath CARDIAC: RRR, no murmur,no extra heart sounds, BLE edema 2+ GI: abdomen soft, normal BS, no masses, no tenderness, no hepatomegaly, no splenomegaly EXTREMITIES:  Able to move X 4 extremities PSYCHIATRIC: Alert and oriented X 3. Affect and behavior are appropriate  LABS/RADIOLOGY: Labs reviewed: 01/04/15  WBC 5.0 hemoglobin 9.9 hematocrit 31.7 MCV 82.8 platelet 241 sodium 132 potassium 4.5 glucose 124 BUN 20 creatinine 2.57 calcium 9.0 magnesium 1.5 Basic Metabolic Panel:  Recent Labs  12/24/14 2114 12/25/14 0540 12/26/14 0900 12/31/14  NA 129* 125* 124* 134*  K 4.1 4.3 3.7 3.8  CL 95* 90*  93*  --   CO2 '24 26 22  '$ --   GLUCOSE 118* 158* 111*  --   BUN '13 12 19 19  '$ CREATININE 0.67 0.62 0.58 0.5  CALCIUM 9.0 8.7* 8.5*  --    CBC:  Recent Labs  12/24/14 2114 12/31/14  WBC 8.5 4.8  NEUTROABS 6.6  --   HGB 12.2 8.9*  HCT 36.0 28*  MCV 78.4  --   PLT 152 207     Dg Chest 2 View  12/24/2014  CLINICAL DATA:  [place] in a parking lot today after losing her balance. Small cell lung cancer with metastases to the liver and bone. EXAM: CHEST  2 VIEW COMPARISON:  None. FINDINGS: Irregular left hilar and perihilar mass measuring 7.9 cm in maximum diameter. Mildly enlarged cardiac silhouette. Post CABG changes. Right jugular port catheter tip in the superior vena cava. Mildly prominent interstitial markings. Right shoulder fixation anchors. Mild thoracic and upper lumbar spine degenerative changes. IMPRESSION: 1. 7.9 cm left hilar and perihilar irregular mass, compatible with the history of lung cancer. 2. Mild cardiomegaly. 3. Mild chronic interstitial lung disease. Electronically Signed   By: Claudie Revering M.D.   On: 12/24/2014 23:56   Ct Head Wo  Contrast  12/24/2014  CLINICAL DATA:  Patient status post fall. Altered mental status. Cervical spine pain. No reported loss of consciousness. History of metastatic lung cancer. EXAM: CT HEAD WITHOUT CONTRAST CT CERVICAL SPINE WITHOUT CONTRAST TECHNIQUE: Multidetector CT imaging of the head and cervical spine was performed following the standard protocol without intravenous contrast. Multiplanar CT image reconstructions of the cervical spine were also generated. COMPARISON:  Cervical spine MRI 02/23/2005. FINDINGS: CT HEAD FINDINGS Extensive periventricular and subcortical white matter hypodensity compatible with chronic small vessel ischemic changes. Ventricles and sulci are prominent compatible with atrophy. No evidence for acute cortically based infarct, intracranial hemorrhage, mass lesion or mass-effect. Fluid and mucosal thickening within the left greater than right maxillary sinuses. Mastoid air cells are well aerated. Calvarium is intact. Soft tissue swelling overlying left frontal calvarium. No evidence for radiopaque foreign body. CT CERVICAL SPINE FINDINGS Straightening of the normal cervical lordosis. No evidence for acute cervical spine fracture. Interval development of patchy sclerotic foci visualized throughout cervical spine with the largest within the C7 and T1 vertebral bodies. Degenerative disc disease most pronounced C5-6. Craniocervical junction is intact. Multiple low-attenuation nodules within the thyroid measuring up to. Focal consolidative opacity is demonstrated within the right lung apex. Patchy ground-glass opacity left upper lobe. IMPRESSION: No acute intracranial process. No acute cervical spine fracture. Multiple sclerotic foci demonstrated throughout the visualized cervical spine compatible with osseous metastasis from known history of lung cancer. Chronic small vessel ischemic changes. Apical consolidation which is nonspecific and may be secondary to post treatment changes, an  infectious or inflammatory process or metastatic disease. We the These results were called by telephone at the time of interpretation on 12/24/2014 at 9:49 pm to Dr. Etta Quill , who verbally acknowledged these results. Electronically Signed   By: Lovey Newcomer M.D.   On: 12/24/2014 21:59   Ct Cervical Spine Wo Contrast  12/24/2014  CLINICAL DATA:  Patient status post fall. Altered mental status. Cervical spine pain. No reported loss of consciousness. History of metastatic lung cancer. EXAM: CT HEAD WITHOUT CONTRAST CT CERVICAL SPINE WITHOUT CONTRAST TECHNIQUE: Multidetector CT imaging of the head and cervical spine was performed following the standard protocol without intravenous contrast. Multiplanar CT image reconstructions of the cervical spine were  also generated. COMPARISON:  Cervical spine MRI 02/23/2005. FINDINGS: CT HEAD FINDINGS Extensive periventricular and subcortical white matter hypodensity compatible with chronic small vessel ischemic changes. Ventricles and sulci are prominent compatible with atrophy. No evidence for acute cortically based infarct, intracranial hemorrhage, mass lesion or mass-effect. Fluid and mucosal thickening within the left greater than right maxillary sinuses. Mastoid air cells are well aerated. Calvarium is intact. Soft tissue swelling overlying left frontal calvarium. No evidence for radiopaque foreign body. CT CERVICAL SPINE FINDINGS Straightening of the normal cervical lordosis. No evidence for acute cervical spine fracture. Interval development of patchy sclerotic foci visualized throughout cervical spine with the largest within the C7 and T1 vertebral bodies. Degenerative disc disease most pronounced C5-6. Craniocervical junction is intact. Multiple low-attenuation nodules within the thyroid measuring up to. Focal consolidative opacity is demonstrated within the right lung apex. Patchy ground-glass opacity left upper lobe. IMPRESSION: No acute intracranial process. No  acute cervical spine fracture. Multiple sclerotic foci demonstrated throughout the visualized cervical spine compatible with osseous metastasis from known history of lung cancer. Chronic small vessel ischemic changes. Apical consolidation which is nonspecific and may be secondary to post treatment changes, an infectious or inflammatory process or metastatic disease. We the These results were called by telephone at the time of interpretation on 12/24/2014 at 9:49 pm to Dr. Etta Quill , who verbally acknowledged these results. Electronically Signed   By: Lovey Newcomer M.D.   On: 12/24/2014 21:59    ASSESSMENT/PLAN:  Left hip intertrochanteric fracture S/P left hip intramedullary nail - for Home health PT, OT Home health aide and Nursing;  OxyContin ER 20 mg 1 tab by mouth every 12 hours and oxycodone 5 mg IR 2 tabs = 10 mg by mouth every 4 hours when necessary for pain; follow-up with orthopedic surgeon   Anemia, acute blood loss - hemoglobin 9.9; continue Nu-Iron 150 mg 1 capsule by mouth twice a day  Small cell carcinoma of lung - follow-up with oncology on 10/19  Hypomagnesemia - continue magnesium chloride 64 mg SR 2 tabs by mouth twice a day; check MG level before discharge  Anxiety - mood is stable; continue Ativan 0.5 mg 1 tab by mouth every 6 hours when necessary  Nausea - continue Zofran 4 mg 1 tab by mouth every 8 hours when necessary and Compazine 10 mg 1 tab by mouth every 6 hours when necessary  Constipation - Miralax was discontinued, started on Linzess 145 mcg 1 capsule PO daily and senna S8.6-50 mg 2 tabs by mouth twice a day  Hyponatremia/SIADH - sodium 123; continue sodium chloride 1000 mg take 2 tabs = 2000 mg by mouth 4 times a day; check BMP before discharge  Insomnia - continue Ambien CR 12.5 mg 1 tab by mouth daily at bedtime when necessary      I have filled out patient's discharge paperwork and written prescriptions.  Patient will receive home health PT, OT, Nursing  and Home health aide.  DME provided:  Rolling walker (58", 140 lbs) and 3 in 1 bedside commode  Total discharge time: Greater than 30 minutes  Discharge time involved coordination of the discharge process with Education officer, museum, nursing staff and therapy department. Medical justification for home health services/DME verified.     [facility], NP [employer] [phone]

## 2015-03-03 DEATH — deceased

## 2017-07-03 IMAGING — CT CT HEAD W/O CM
3 of 7 series · 14 of 47 positions shown, 16 images · non-contrast
Comparison: Cervical spine MRI 02/23/2005.

CLINICAL DATA: Patient status post fall. Altered mental status.
Cervical spine pain. No reported loss of consciousness. History of
metastatic lung cancer.

EXAM:
CT HEAD WITHOUT CONTRAST
CT CERVICAL SPINE WITHOUT CONTRAST
TECHNIQUE: Multidetector CT imaging of the head and cervical spine was
performed following the standard protocol without intravenous
contrast. Multiplanar CT image reconstructions of the cervical spine
were also generated.

[Series 4: bone windows · axial · 0.43mm/px · z∈[+1602,+1698]mm · 4 of 54 slices shown]
[im 11/54  bone]
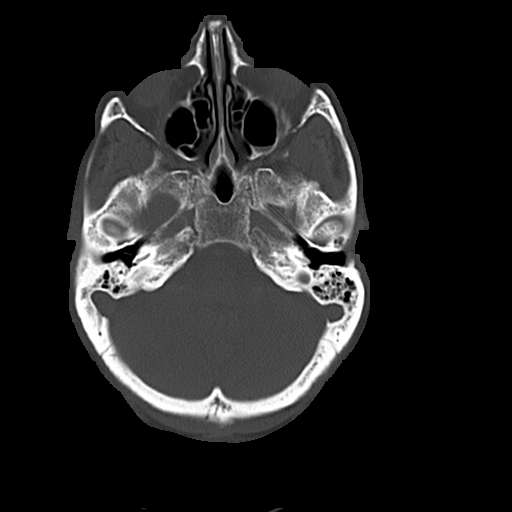
[im 22/54  bone]
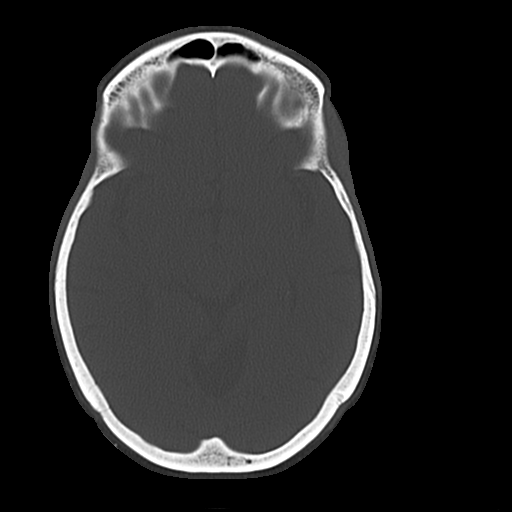
[im 32/54  bone]
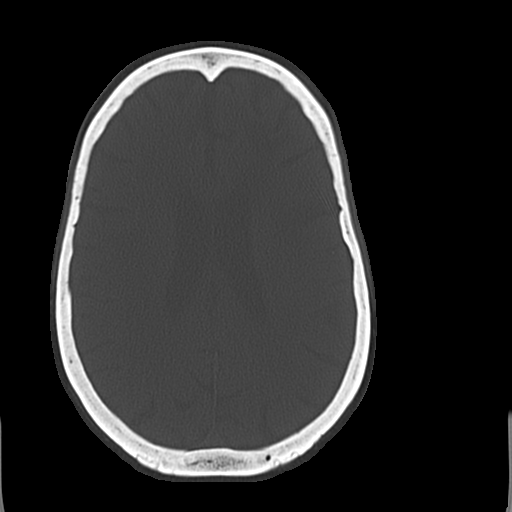
[im 43/54  bone]
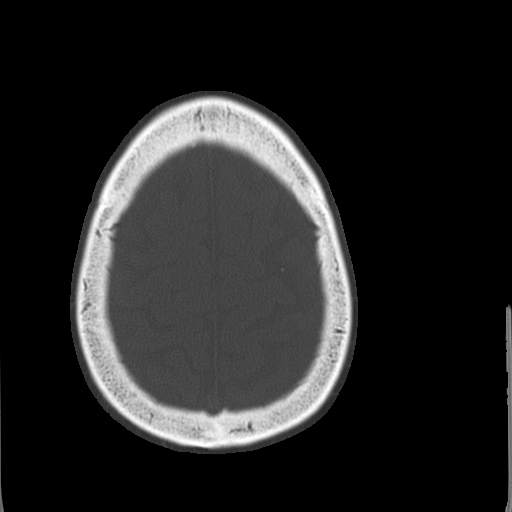

[Series 9: axial reformats · axial · 0.23mm/px · z∈[+1433,+1555]mm · 7 of 86 slices shown, 9 images]
[im 11/86  brain]
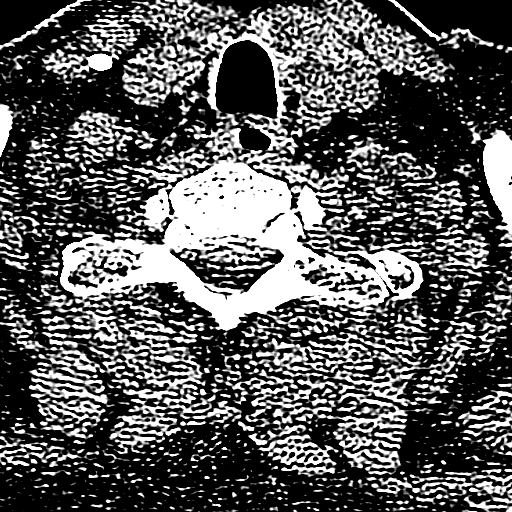
[im 11/86  bone]
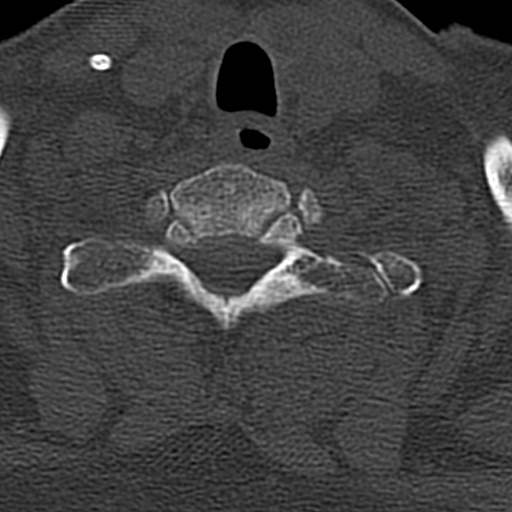
[im 22/86  brain]
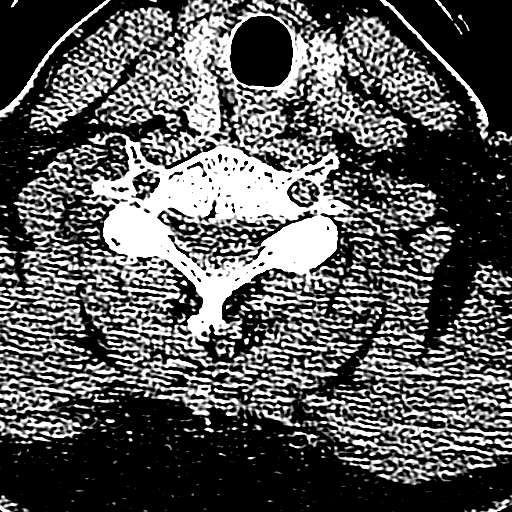
[im 32/86  brain]
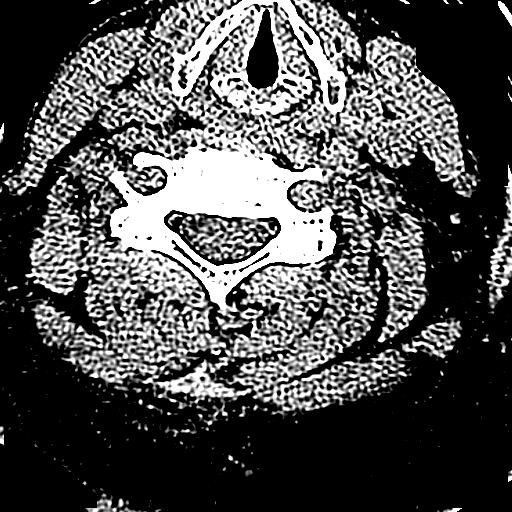
[im 43/86  brain]
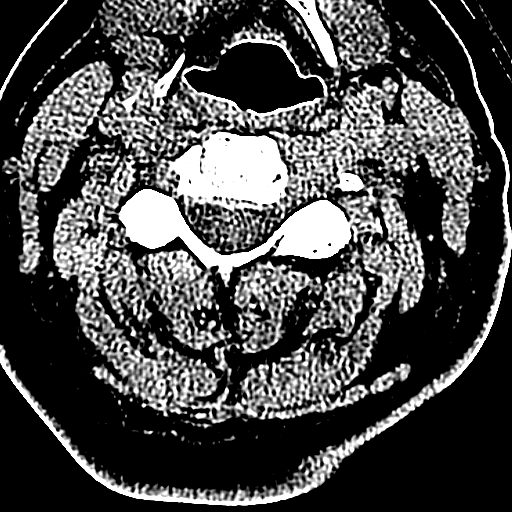
[im 54/86  brain]
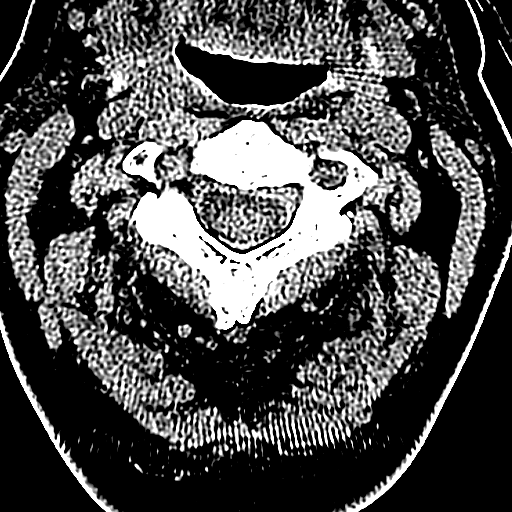
[im 54/86  bone]
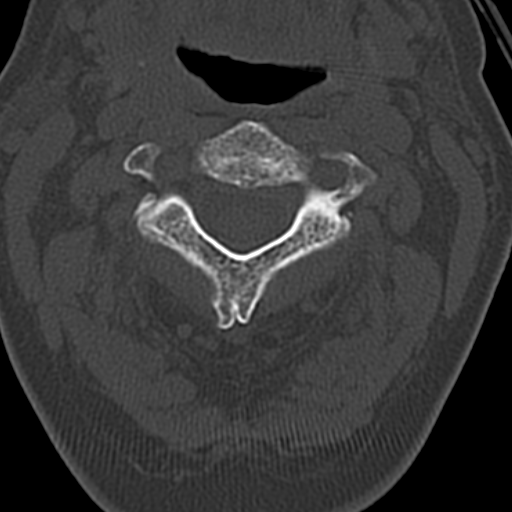
[im 64/86  brain]
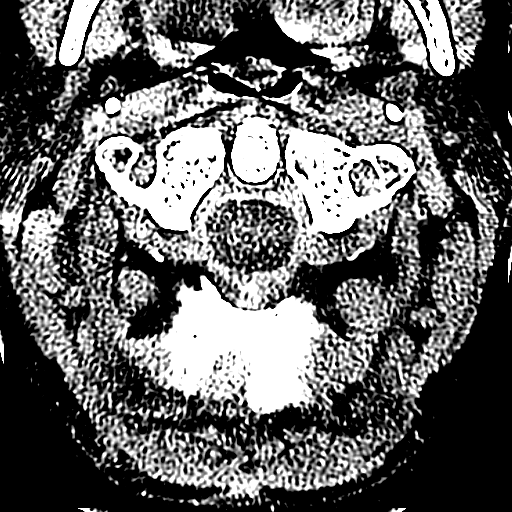
[im 75/86  brain]
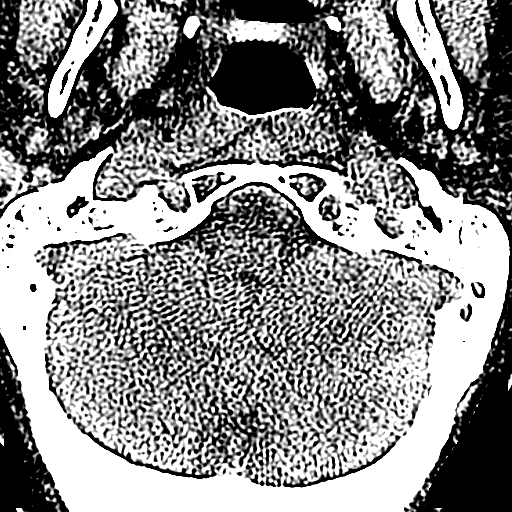

[Series 10: coronal recons · coronal · 0.23mm/px · 3 of 61 slices shown]
[im 21/61  brain]
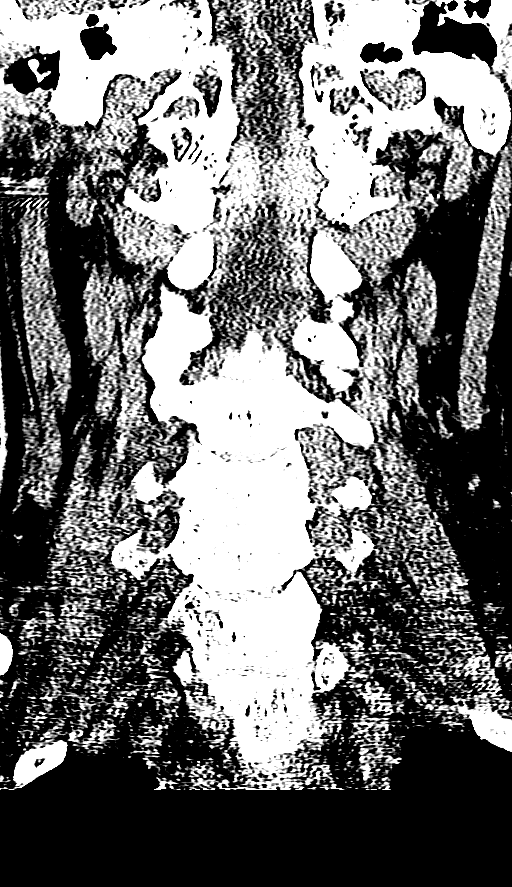
[im 27/61  brain]
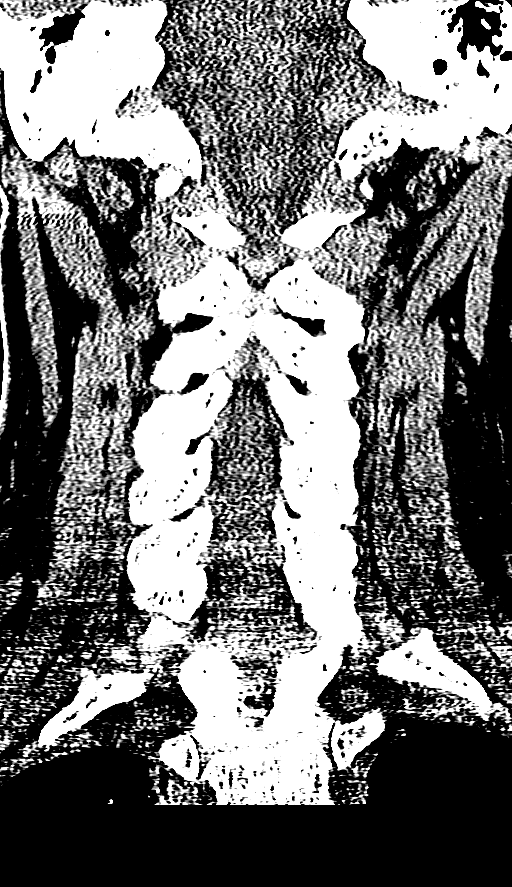
[im 34/61  brain]
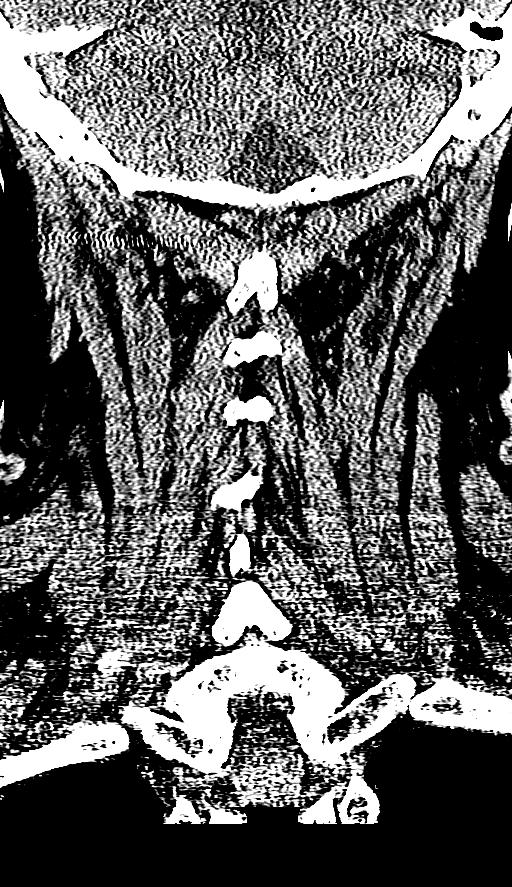

[14 of 47 positions shown; findings below may reference images not displayed]

FINDINGS: CT HEAD FINDINGS

Extensive periventricular and subcortical white matter hypodensity
compatible with chronic small vessel ischemic changes. Ventricles
and sulci are prominent compatible with atrophy. No evidence for
acute cortically based infarct, intracranial hemorrhage, mass lesion
or mass-effect. Fluid and mucosal thickening within the left greater
than right maxillary sinuses. Mastoid air cells are well aerated.
Calvarium is intact. Soft tissue swelling overlying left frontal
calvarium. No evidence for radiopaque foreign body.

CT CERVICAL SPINE FINDINGS

Straightening of the normal cervical lordosis. No evidence for acute
cervical spine fracture. Interval development of patchy sclerotic
foci visualized throughout cervical spine with the largest within
the C7 and T1 vertebral bodies. Degenerative disc disease most
pronounced C5-6. Craniocervical junction is intact. Multiple
low-attenuation nodules within the thyroid measuring up to. Focal
consolidative opacity is demonstrated within the right lung apex.
Patchy ground-glass opacity left upper lobe.
IMPRESSION: No acute intracranial process.

No acute cervical spine fracture.

Multiple sclerotic foci demonstrated throughout the visualized
cervical spine compatible with osseous metastasis from known history
of lung cancer.

Chronic small vessel ischemic changes.

Apical consolidation which is nonspecific and may be secondary to
post treatment changes, an infectious or inflammatory process or
metastatic disease. We the

These results were called by telephone at the time of interpretation
on 12/24/2014 at [DATE] to Dr. RACIEL ROCKWELL , who verbally
acknowledged these results.
